# Patient Record
Sex: Female | Born: 1965 | Race: Black or African American | Hispanic: No | Marital: Single | Smoking: Never smoker
Health system: Southern US, Community
[De-identification: ages and names within clinical notes are randomized; demographics above are authoritative.]

## PROBLEM LIST (undated history)

## (undated) DIAGNOSIS — I10 Essential (primary) hypertension: Secondary | ICD-10-CM

## (undated) DIAGNOSIS — Z9289 Personal history of other medical treatment: Secondary | ICD-10-CM

## (undated) DIAGNOSIS — D649 Anemia, unspecified: Secondary | ICD-10-CM

## (undated) DIAGNOSIS — E785 Hyperlipidemia, unspecified: Secondary | ICD-10-CM

## (undated) DIAGNOSIS — D500 Iron deficiency anemia secondary to blood loss (chronic): Secondary | ICD-10-CM

## (undated) HISTORY — DX: Iron deficiency anemia secondary to blood loss (chronic): D50.0

## (undated) HISTORY — DX: Anemia, unspecified: D64.9

## (undated) HISTORY — DX: Hyperlipidemia, unspecified: E78.5

---

## 2004-08-14 ENCOUNTER — Ambulatory Visit: Payer: Self-pay | Admitting: Family Medicine

## 2006-04-07 ENCOUNTER — Emergency Department: Payer: Self-pay | Admitting: Emergency Medicine

## 2006-04-19 ENCOUNTER — Encounter (HOSPITAL_COMMUNITY): Admission: RE | Admit: 2006-04-19 | Discharge: 2006-05-18 | Payer: Self-pay | Admitting: Oncology

## 2006-04-19 ENCOUNTER — Ambulatory Visit: Payer: Self-pay | Admitting: Oncology

## 2006-04-19 ENCOUNTER — Ambulatory Visit: Payer: Self-pay | Admitting: Family Medicine

## 2006-04-19 LAB — CBC WITH DIFFERENTIAL (CANCER CENTER ONLY)
BASO#: 0 10*3/uL (ref 0.0–0.2)
BASO%: 0.4 % (ref 0.0–2.0)
EOS%: 1.5 % (ref 0.0–7.0)
HCT: 26 % — ABNORMAL LOW (ref 34.8–46.6)
HGB: 8 g/dL — ABNORMAL LOW (ref 11.6–15.9)
LYMPH#: 2.9 10*3/uL (ref 0.9–3.3)
MONO#: 0.5 10*3/uL (ref 0.1–0.9)
NEUT#: 5.6 10*3/uL (ref 1.5–6.5)
NEUT%: 60.7 % (ref 39.6–80.0)
RDW: 15.4 % — ABNORMAL HIGH (ref 10.5–14.6)
WBC: 9.2 10*3/uL (ref 3.9–10.0)

## 2006-04-19 LAB — COMPREHENSIVE METABOLIC PANEL
ALT: 13 U/L (ref 0–40)
Albumin: 4 g/dL (ref 3.5–5.2)
CO2: 20 mEq/L (ref 19–32)
Calcium: 9 mg/dL (ref 8.4–10.5)
Chloride: 107 mEq/L (ref 96–112)
Glucose, Bld: 143 mg/dL — ABNORMAL HIGH (ref 70–99)
Potassium: 4.1 mEq/L (ref 3.5–5.3)
Sodium: 138 mEq/L (ref 135–145)
Total Protein: 7.3 g/dL (ref 6.0–8.3)

## 2006-04-19 LAB — IRON AND TIBC
%SAT: 4 % — ABNORMAL LOW (ref 20–55)
Iron: 17 ug/dL — ABNORMAL LOW (ref 42–145)

## 2006-04-19 LAB — FOLATE: Folate: >20 ng/mL

## 2006-04-19 LAB — FERRITIN: Ferritin: 1 ng/mL — ABNORMAL LOW (ref 10–291)

## 2006-04-19 LAB — PROTIME-INR (CHCC SATELLITE): Protime: 12.5 Seconds (ref 10.6–13.4)

## 2006-04-19 LAB — LACTATE DEHYDROGENASE: LDH: 140 U/L (ref 94–250)

## 2006-04-19 LAB — RETICULOCYTES (CHCC): Retic Ct Pct: 1.6 % (ref 0.4–3.1)

## 2006-04-21 LAB — TSH: TSH: 1.967 u[IU]/mL (ref 0.350–5.500)

## 2006-04-21 LAB — VON WILLEBRAND PANEL: Ristocetin-Cofactor: 97 % (ref 50–150)

## 2006-05-03 ENCOUNTER — Ambulatory Visit: Payer: Self-pay | Admitting: Family Medicine

## 2006-05-03 ENCOUNTER — Encounter: Payer: Self-pay | Admitting: Family Medicine

## 2006-05-17 LAB — CBC WITH DIFFERENTIAL (CANCER CENTER ONLY)
BASO#: 0.2 10*3/uL (ref 0.0–0.2)
EOS%: 2.8 % (ref 0.0–7.0)
Eosinophils Absolute: 0.4 10*3/uL (ref 0.0–0.5)
HCT: 30.4 % — ABNORMAL LOW (ref 34.8–46.6)
HGB: 9.4 g/dL — ABNORMAL LOW (ref 11.6–15.9)
LYMPH#: 2.8 10*3/uL (ref 0.9–3.3)
MCHC: 31 g/dL — ABNORMAL LOW (ref 32.0–36.0)
NEUT#: 8.9 10*3/uL — ABNORMAL HIGH (ref 1.5–6.5)
NEUT%: 69.4 % (ref 39.6–80.0)
RBC: 4.14 10*6/uL (ref 3.70–5.32)

## 2006-05-24 ENCOUNTER — Ambulatory Visit: Payer: Self-pay | Admitting: Family Medicine

## 2006-06-03 ENCOUNTER — Ambulatory Visit: Payer: Self-pay | Admitting: Oncology

## 2006-06-07 LAB — CBC WITH DIFFERENTIAL (CANCER CENTER ONLY)
BASO%: 0.8 % (ref 0.0–2.0)
EOS%: 2.4 % (ref 0.0–7.0)
LYMPH#: 3.9 10*3/uL — ABNORMAL HIGH (ref 0.9–3.3)
MCHC: 31.6 g/dL — ABNORMAL LOW (ref 32.0–36.0)
MONO#: 0.8 10*3/uL (ref 0.1–0.9)
Platelets: 521 10*3/uL — ABNORMAL HIGH (ref 145–400)
RDW: 23.1 % — ABNORMAL HIGH (ref 10.5–14.6)
WBC: 11 10*3/uL — ABNORMAL HIGH (ref 3.9–10.0)

## 2006-06-07 LAB — IRON AND TIBC
%SAT: 24 % (ref 20–55)
Iron: 77 ug/dL (ref 42–145)
TIBC: 316 ug/dL (ref 250–470)
UIBC: 239 ug/dL

## 2006-06-07 LAB — FERRITIN: Ferritin: 104 ng/mL (ref 10–291)

## 2006-06-28 LAB — CBC WITH DIFFERENTIAL (CANCER CENTER ONLY)
BASO%: 0.7 % (ref 0.0–2.0)
EOS%: 1.7 % (ref 0.0–7.0)
LYMPH%: 36.9 % (ref 14.0–48.0)
MCH: 25.6 pg — ABNORMAL LOW (ref 26.0–34.0)
MCHC: 32.7 g/dL (ref 32.0–36.0)
MCV: 78 fL — ABNORMAL LOW (ref 81–101)
MONO#: 0.6 10*3/uL (ref 0.1–0.9)
MONO%: 6.2 % (ref 0.0–13.0)
Platelets: 512 10*3/uL — ABNORMAL HIGH (ref 145–400)
RDW: 19.1 % — ABNORMAL HIGH (ref 10.5–14.6)
WBC: 9 10*3/uL (ref 3.9–10.0)

## 2006-06-28 LAB — IRON AND TIBC: TIBC: 337 ug/dL (ref 250–470)

## 2006-07-13 ENCOUNTER — Ambulatory Visit: Payer: Self-pay | Admitting: Oncology

## 2006-08-23 DIAGNOSIS — D649 Anemia, unspecified: Secondary | ICD-10-CM

## 2006-08-23 HISTORY — DX: Anemia, unspecified: D64.9

## 2006-09-16 ENCOUNTER — Ambulatory Visit: Payer: Self-pay | Admitting: Oncology

## 2006-09-20 LAB — IRON AND TIBC
Iron: 58 ug/dL (ref 42–145)
UIBC: 267 ug/dL

## 2006-09-20 LAB — CBC WITH DIFFERENTIAL (CANCER CENTER ONLY)
BASO#: 0.1 10*3/uL (ref 0.0–0.2)
EOS%: 1.8 % (ref 0.0–7.0)
Eosinophils Absolute: 0.2 10*3/uL (ref 0.0–0.5)
HCT: 35.7 % (ref 34.8–46.6)
HGB: 11.5 g/dL — ABNORMAL LOW (ref 11.6–15.9)
LYMPH%: 33.6 % (ref 14.0–48.0)
MCH: 27.4 pg (ref 26.0–34.0)
MCHC: 32.4 g/dL (ref 32.0–36.0)
MCV: 85 fL (ref 81–101)
MONO%: 4.5 % (ref 0.0–13.0)
NEUT%: 59.5 % (ref 39.6–80.0)
RBC: 4.22 10*6/uL (ref 3.70–5.32)

## 2006-09-20 LAB — FERRITIN: Ferritin: 44 ng/mL (ref 10–291)

## 2006-12-27 ENCOUNTER — Ambulatory Visit: Payer: Self-pay | Admitting: Family Medicine

## 2007-02-07 ENCOUNTER — Ambulatory Visit: Payer: Self-pay | Admitting: Family Medicine

## 2007-03-08 ENCOUNTER — Emergency Department: Payer: Self-pay | Admitting: Emergency Medicine

## 2007-03-21 ENCOUNTER — Ambulatory Visit: Payer: Self-pay | Admitting: Family Medicine

## 2007-06-19 ENCOUNTER — Ambulatory Visit: Payer: Self-pay | Admitting: Gynecology

## 2007-06-21 ENCOUNTER — Emergency Department: Payer: Self-pay | Admitting: Emergency Medicine

## 2007-06-21 ENCOUNTER — Other Ambulatory Visit: Payer: Self-pay

## 2007-09-14 ENCOUNTER — Ambulatory Visit: Payer: Self-pay | Admitting: Oncology

## 2008-02-26 ENCOUNTER — Ambulatory Visit: Payer: Self-pay | Admitting: Oncology

## 2008-02-27 LAB — CBC WITH DIFFERENTIAL (CANCER CENTER ONLY)
BASO#: 0.1 10*3/uL (ref 0.0–0.2)
Eosinophils Absolute: 0.1 10*3/uL (ref 0.0–0.5)
HGB: 10.4 g/dL — ABNORMAL LOW (ref 11.6–15.9)
LYMPH%: 34.7 % (ref 14.0–48.0)
MCH: 23.9 pg — ABNORMAL LOW (ref 26.0–34.0)
MCV: 74 fL — ABNORMAL LOW (ref 81–101)
MONO%: 6.4 % (ref 0.0–13.0)
NEUT#: 5.3 10*3/uL (ref 1.5–6.5)
RBC: 4.36 10*6/uL (ref 3.70–5.32)

## 2008-02-27 LAB — IRON AND TIBC
%SAT: 5 % — ABNORMAL LOW (ref 20–55)
Iron: 18 ug/dL — ABNORMAL LOW (ref 42–145)
TIBC: 350 ug/dL (ref 250–470)

## 2008-02-27 LAB — FERRITIN: Ferritin: 6 ng/mL — ABNORMAL LOW (ref 10–291)

## 2008-03-25 ENCOUNTER — Ambulatory Visit: Payer: Self-pay | Admitting: Family Medicine

## 2008-04-02 ENCOUNTER — Ambulatory Visit (HOSPITAL_COMMUNITY): Admission: RE | Admit: 2008-04-02 | Discharge: 2008-04-02 | Payer: Self-pay | Admitting: Gynecology

## 2008-04-11 ENCOUNTER — Ambulatory Visit: Payer: Self-pay | Admitting: Obstetrics & Gynecology

## 2008-04-11 ENCOUNTER — Other Ambulatory Visit: Admission: RE | Admit: 2008-04-11 | Discharge: 2008-04-11 | Payer: Self-pay | Admitting: Obstetrics & Gynecology

## 2008-04-11 ENCOUNTER — Encounter: Payer: Self-pay | Admitting: Obstetrics & Gynecology

## 2008-04-11 LAB — CBC WITH DIFFERENTIAL (CANCER CENTER ONLY)
Eosinophils Absolute: 0.2 10*3/uL (ref 0.0–0.5)
LYMPH%: 26 % (ref 14.0–48.0)
MCH: 25.9 pg — ABNORMAL LOW (ref 26.0–34.0)
MCV: 80 fL — ABNORMAL LOW (ref 81–101)
MONO%: 6.4 % (ref 0.0–13.0)
NEUT%: 65.4 % (ref 39.6–80.0)
Platelets: 402 10*3/uL — ABNORMAL HIGH (ref 145–400)
RBC: 4.24 10*6/uL (ref 3.70–5.32)

## 2008-04-11 LAB — IRON AND TIBC
Iron: 51 ug/dL (ref 42–145)
UIBC: 213 ug/dL

## 2008-04-22 ENCOUNTER — Inpatient Hospital Stay (HOSPITAL_COMMUNITY): Admission: AD | Admit: 2008-04-22 | Discharge: 2008-04-22 | Payer: Self-pay | Admitting: Obstetrics & Gynecology

## 2008-04-22 ENCOUNTER — Ambulatory Visit (HOSPITAL_COMMUNITY): Admission: RE | Admit: 2008-04-22 | Discharge: 2008-04-22 | Payer: Self-pay | Admitting: Obstetrics & Gynecology

## 2008-05-02 ENCOUNTER — Ambulatory Visit: Payer: Self-pay | Admitting: Oncology

## 2008-05-07 ENCOUNTER — Ambulatory Visit: Payer: Self-pay | Admitting: Obstetrics & Gynecology

## 2008-05-07 LAB — CBC WITH DIFFERENTIAL (CANCER CENTER ONLY)
BASO#: 0 10*3/uL (ref 0.0–0.2)
Eosinophils Absolute: 0.2 10*3/uL (ref 0.0–0.5)
HCT: 29.7 % — ABNORMAL LOW (ref 34.8–46.6)
LYMPH#: 2.7 10*3/uL (ref 0.9–3.3)
MCH: 27.4 pg (ref 26.0–34.0)
MCHC: 33.6 g/dL (ref 32.0–36.0)
MCV: 82 fL (ref 81–101)
MONO#: 0.5 10*3/uL (ref 0.1–0.9)
NEUT#: 4.3 10*3/uL (ref 1.5–6.5)
WBC: 7.7 10*3/uL (ref 3.9–10.0)

## 2008-05-07 LAB — IRON AND TIBC
%SAT: 56 % — ABNORMAL HIGH (ref 20–55)
TIBC: 323 ug/dL (ref 250–470)
UIBC: 143 ug/dL

## 2008-05-16 ENCOUNTER — Ambulatory Visit: Payer: Self-pay | Admitting: Obstetrics & Gynecology

## 2008-05-20 ENCOUNTER — Ambulatory Visit: Payer: Self-pay | Admitting: Family Medicine

## 2008-06-10 LAB — CBC WITH DIFFERENTIAL (CANCER CENTER ONLY)
Eosinophils Absolute: 0.2 10*3/uL (ref 0.0–0.5)
HCT: 35.1 % (ref 34.8–46.6)
LYMPH%: 30.7 % (ref 14.0–48.0)
MCH: 27.2 pg (ref 26.0–34.0)
MCV: 82 fL (ref 81–101)
MONO#: 0.7 10*3/uL (ref 0.1–0.9)
MONO%: 6.3 % (ref 0.0–13.0)
NEUT%: 60.3 % (ref 39.6–80.0)
Platelets: 397 10*3/uL (ref 145–400)
RBC: 4.29 10*6/uL (ref 3.70–5.32)
WBC: 10.7 10*3/uL — ABNORMAL HIGH (ref 3.9–10.0)

## 2008-06-10 LAB — IRON AND TIBC: Iron: 64 ug/dL (ref 42–145)

## 2008-06-10 LAB — FERRITIN: Ferritin: 19 ng/mL (ref 10–291)

## 2008-08-07 ENCOUNTER — Ambulatory Visit: Payer: Self-pay | Admitting: Oncology

## 2008-08-08 LAB — CBC WITH DIFFERENTIAL (CANCER CENTER ONLY)
BASO#: 0.1 10*3/uL (ref 0.0–0.2)
Eosinophils Absolute: 0.2 10*3/uL (ref 0.0–0.5)
HGB: 10.1 g/dL — ABNORMAL LOW (ref 11.6–15.9)
LYMPH%: 32.5 % (ref 14.0–48.0)
MCH: 25.8 pg — ABNORMAL LOW (ref 26.0–34.0)
MCV: 80 fL — ABNORMAL LOW (ref 81–101)
MONO#: 0.5 10*3/uL (ref 0.1–0.9)
MONO%: 4.6 % (ref 0.0–13.0)
NEUT#: 6 10*3/uL (ref 1.5–6.5)
RBC: 3.91 10*6/uL (ref 3.70–5.32)
WBC: 10 10*3/uL (ref 3.9–10.0)

## 2008-08-09 LAB — IRON AND TIBC
%SAT: 8 % — ABNORMAL LOW (ref 20–55)
Iron: 26 ug/dL — ABNORMAL LOW (ref 42–145)
UIBC: 314 ug/dL

## 2008-08-09 LAB — FERRITIN: Ferritin: 8 ng/mL — ABNORMAL LOW (ref 10–291)

## 2008-10-29 ENCOUNTER — Ambulatory Visit: Payer: Self-pay | Admitting: Oncology

## 2008-11-04 LAB — CBC WITH DIFFERENTIAL (CANCER CENTER ONLY)
BASO#: 0.1 10*3/uL (ref 0.0–0.2)
Eosinophils Absolute: 0.2 10*3/uL (ref 0.0–0.5)
HGB: 9.7 g/dL — ABNORMAL LOW (ref 11.6–15.9)
LYMPH#: 3.2 10*3/uL (ref 0.9–3.3)
NEUT#: 6.9 10*3/uL — ABNORMAL HIGH (ref 1.5–6.5)
Platelets: 492 10*3/uL — ABNORMAL HIGH (ref 145–400)
RBC: 4.03 10*6/uL (ref 3.70–5.32)
WBC: 11.1 10*3/uL — ABNORMAL HIGH (ref 3.9–10.0)

## 2008-11-12 ENCOUNTER — Ambulatory Visit: Payer: Self-pay | Admitting: Obstetrics & Gynecology

## 2008-11-14 ENCOUNTER — Ambulatory Visit (HOSPITAL_COMMUNITY): Admission: RE | Admit: 2008-11-14 | Discharge: 2008-11-14 | Payer: Self-pay | Admitting: Family Medicine

## 2008-12-31 ENCOUNTER — Ambulatory Visit (HOSPITAL_COMMUNITY): Admission: RE | Admit: 2008-12-31 | Discharge: 2008-12-31 | Payer: Self-pay | Admitting: Obstetrics & Gynecology

## 2008-12-31 ENCOUNTER — Encounter: Payer: Self-pay | Admitting: Obstetrics & Gynecology

## 2008-12-31 ENCOUNTER — Ambulatory Visit: Payer: Self-pay | Admitting: Obstetrics & Gynecology

## 2008-12-31 HISTORY — PX: OTHER SURGICAL HISTORY: SHX169

## 2009-01-14 ENCOUNTER — Ambulatory Visit: Payer: Self-pay | Admitting: Obstetrics & Gynecology

## 2009-03-17 IMAGING — CR DG ABDOMEN 1V
1 series · 1 of 1 positions shown · non-contrast
Comparison: Pelvic ultrasound 04/02/2008.

CLINICAL DATA: 42-year-old female evaluate for IUD.

ABDOMEN - 1 VIEW

[t abdomen supine]
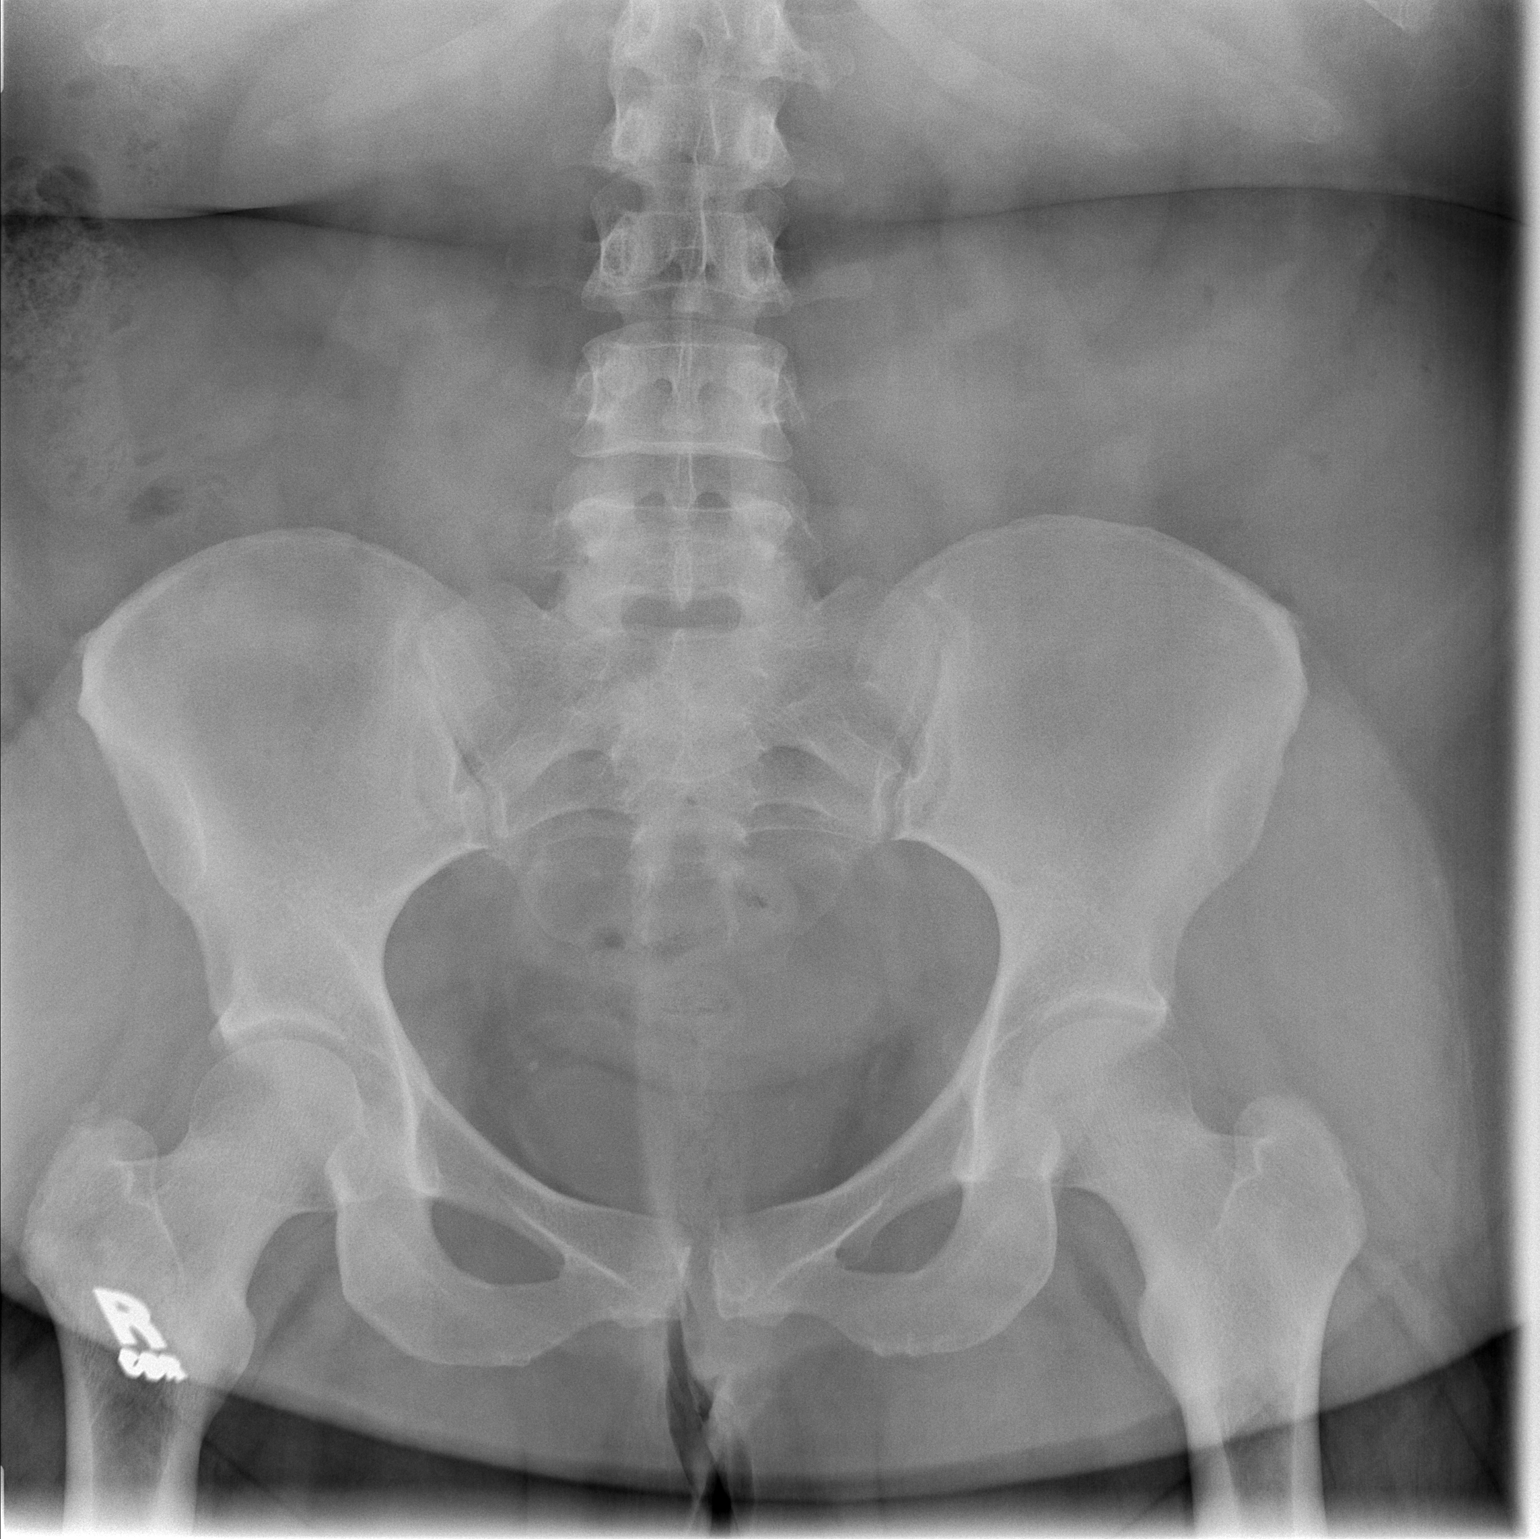

[1 of 1 positions shown; findings below may reference images not displayed]

FINDINGS: Bone mineralization is within normal limits.  Paucity of
bowel gas.  Several pelvic phleboliths.  No intrauterine device
identified.  No metallic density foreign body is identified in the
visualized lower abdomen or pelvis. No acute osseous abnormality
identified.
IMPRESSION: No IUD or any metallic structure identified in the visualized lower
abdomen or pelvis.

## 2009-04-03 ENCOUNTER — Ambulatory Visit: Payer: Self-pay | Admitting: Oncology

## 2009-05-19 ENCOUNTER — Ambulatory Visit: Payer: Self-pay | Admitting: Oncology

## 2010-12-01 LAB — BASIC METABOLIC PANEL
BUN: 3 mg/dL — ABNORMAL LOW (ref 6–23)
CO2: 25 mEq/L (ref 19–32)
Chloride: 102 mEq/L (ref 96–112)
Glucose, Bld: 118 mg/dL — ABNORMAL HIGH (ref 70–99)
Potassium: 3.1 mEq/L — ABNORMAL LOW (ref 3.5–5.1)

## 2010-12-01 LAB — CBC
HCT: 34.9 % — ABNORMAL LOW (ref 36.0–46.0)
MCV: 77.7 fL — ABNORMAL LOW (ref 78.0–100.0)
Platelets: 516 10*3/uL — ABNORMAL HIGH (ref 150–400)
RDW: 17.4 % — ABNORMAL HIGH (ref 11.5–15.5)

## 2010-12-01 LAB — HCG, SERUM, QUALITATIVE: Preg, Serum: NEGATIVE

## 2011-01-05 NOTE — Assessment & Plan Note (Signed)
Courtney Cox, Courtney Cox               ACCOUNT NO.:  [id number]   MEDICAL RECORD NO.:  [id number]          PATIENT TYPE:  POB   LOCATION:  [facility] at [facility]         FACILITY:  [facility]   PHYSICIAN:  Johnella Moloney, MD        DATE OF BIRTH:  October 11, 1965   DATE OF SERVICE:  11/12/2008                                  CLINIC NOTE   CHIEF COMPLAINT:  Menometrorrhagia, anemia, desires surgical management.   HISTORY OF PRESENT ILLNESS:  The patient is a 45 year old gravida 4,  para 3-0-1-3, who has been followed in the clinic since 2007 for  menometrorrhagia.  The patient has had multiple episodes of having  needing transfusions for hemoglobin as low as 5.  She has had an Mirena  IUD trial in the past and has also been tried on Lupron recently.  The  patient was initially offered an endometrial ablation in September 2009,  but she was unable to do this at that time.  She was then just given  Lupron which the patient said made her feel crazy, and she does not  want to try this again.  The patient is interested in a nonsurgical  management.  She does report that her last menstrual period started on  November 05, 2008, and lasted for 7 days.  She said that she checked her  hemoglobin at an outside medical center and that it was very low, but  she did not need a transfusion.  She denies any presyncope,  lightheadedness, dizziness, or any other concerns currently.   PAST MEDICAL HISTORY:  1. Type 2 diabetes.  2. Hypertension.  3. Hypercholesteremia.  4. Morbid obesity.  5. Chronic anemia.   PAST SURGICAL HISTORY:  Oral surgery, no abdominal surgery.   MEDICATIONS:  1. Lipitor 20 mg p.o. daily.  2. Hydrochlorothiazide 25 mg p.o. daily.  3. Lovaza.  4. Fish oil daily.  5. Aspirin daily.  6. Vitamin D daily.   ALLERGIES:  No known drug allergies.  The patient is not allergic to  latex.   FAMILY HISTORY:  Remarkable for hypertension, coronary arteries, and  diabetes.   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  She has no tobacco, alcohol, or illicit drug use history.   PAST OB/GYN HISTORY:  The patient has had 3 vaginal deliveries and 1  miscarriage.  She has had menometrorrhagia for several years and has had  negative endometrial biopsies.  Her last Pap smear was in August 2009,  also was normal.   PHYSICAL EXAMINATION:  VITAL SIGNS:  Blood pressure 144/87, pulse 107,  weight 320 pounds, height 5 feet 5-1/2 inches.  GENERAL:  No apparent distress.  ABDOMEN:  Soft, obese, nontender, and nondistended.  PELVIC:  Normal external female genitalia.  Pink and well-rugated  vagina.  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  She said that this has been  there for several years.  She has a multiparous cervix and normal  contour.  No abnormal drainage or discharge seen.  On Valsalva, the  patient is noted to have very good descent.  On bimanual exam, the  patient had a normal-sized uterus.  No abnormal masses palpated.  However, exam was difficult secondary to habitus.   ASSESSMENT AND PLAN:  The patient is a 45 year old, gravida 4, para 3-0-  1-3, with long history of menometrorrhagia that has not been alleviated  by multiple modalities.  The patient is interested in surgical  evaluation and management.  She was initially interested in hysterectomy  but given her multiple medical problems and her obesity, she is not a  good surgical candidate for major surgery.  At this point, the patient  was counseled regarding getting an endometrial ablation.  On ultrasound  that was done in August 2009, she was noted to have a 4.8-cm fibroid in  the central uterus likely submucosal, and this could be tough for an  endometrial ablation.  However, the patient could have a hysteroscopic  myomectomy and then an ablation and this is what was told to her.  Also  given that she has taken Lupron,  there could be significant decrease in  the size of the submucosal fibroid.  As such, I will get another pelvic  ultrasound just to follow this to see if she will be a good candidate  for a hydrothermal ablation.  She was given written information about  hydrothermal dilation and the risks and benefits of this.  The patient  is interested in this modality for now.  She does understand that if  this does not work then the next option would be to do hysterectomy.  Given her exam, she will be a good candidate for vaginal hysterectomy if  this is needed.  The risks of surgery including bleeding, infection,  injury to surrounding organs, need for additional procedures were  explained to the patient and she was told that these risks will be re-  reviewed on the day of surgery.  She was told to expect a call from the  surgical scheduler about the time and date of her surgery.           ______________________________  Johnella Moloney, MD     UD/MEDQ  D:  11/12/2008  T:  11/13/2008  Job:  [id number]

## 2011-01-05 NOTE — Op Note (Signed)
Courtney Cox, Courtney Cox               ACCOUNT NO.:  [id number]   MEDICAL RECORD NO.:  [id number]          PATIENT TYPE:  AMB   LOCATION:  [facility]                           FACILITY:  [facility]   PHYSICIAN:  Norton Blizzard, MD    DATE OF BIRTH:  1966/07/12   DATE OF PROCEDURE:  12/31/2008  DATE OF DISCHARGE:                               OPERATIVE REPORT   PREOPERATIVE DIAGNOSES:  1. Menometrorrhagia.  2. Morbid obesity.   POSTOPERATIVE DIAGNOSES:  1. Menometrorrhagia.  2. Morbid obesity.   PROCEDURE:  Hysteroscopy, dilation and curettage, hydrothermal  endometrial ablation.   SURGEON:  Norton Blizzard, MD   ANESTHESIA:  General LMA and a paracervical block, using 30 mL of 0.5%  Marcaine.   IV FLUIDS:  500 mL of lactated Ringers.   ESTIMATED BLOOD LOSS:  50 mL.   URINE OUTPUT:  The patient was straight catheterized in the beginning of  the case for an unmeasured amount of clear yellow urine.   INDICATIONS:  The patient is a 45 year old gravida 4, para 3-0-1-3 with  a long history of menometrorrhagia and symptomatic anemia requiring  transfusions in the past.  She has also tried a Mirena IUD and Lupron  which did not help her symptoms.  The patient also has a past medical  history is notable for type 2 diabetes, hypertension, morbid obesity,  and chronic anemia.  The patient desired surgical management and agreed  to hydrothermal ablation.  Of note, she did have an ultrasound done in  March 2010, which showed an enlarged uterus about 13-weeks size with an  anterior-posterior width of 7.8 cm and transverse width of 8.9 cm and 3  distinct fibroids were noted including one that measured about 5.3 cm in  diameter located in the right lateral fundus was deviating the  endometrial canal, suggesting a possible submucosal component.  There  other ones were less than 2 cm in diameter.  She had normal bilateral  adnexa.  Given the possibility of a significant submucosal component,  the patient  was also counseled regarding possible hysteroscopic  myomectomy prior to the ablation.  The risks of surgery including  bleeding, infection, injury to surrounding organs and uterus, need for  additional procedures, electrolyte imbalances due to the distention  medium were discussed with the patient and written informed consent was  obtained.   FINDINGS:  A 13-week-sized fibroid uterus, diffuse proliferative  endometrium.  No submucosal fibroid was noted, but there was a diffuse  proliferative endometrium and possible polypoid tissue. Endometrial  curettings were obtained prior to the ablation. Total ablation time was  14 minutes and 30 seconds at a temperature of 99-degree centigrade.  Normal ostia bilaterally.   SPECIMENS:  Endometrial curettings.   DISPOSITION:  Specimens to Pathology.   COMPLICATIONS:  None immediate.   PROCEDURE IN DETAILS:  The patient had sequential compressive devices  applied to her bilateral lower extremities and had 2 g of cefotetan  administered intravenously while in the preoperative room.  She was then  taken to the operating room where general anesthesia with LMA was  administered  and found to be adequate.  The patient was then placed in  the dorsal lithotomy position, and prepped and draped in a sterile  manner.  Her bladder was straight catheterized for an unmeasured amount  of clear yellow urine.  Attention was turned to the patient's pelvis  where a speculum was placed in the patient's vagina, and the anterior  lip of the cervix was grasped with a tenaculum.  A paracervical block  was then administered using 0.5% Marcaine and a total of 30 mL was used.  At this point, the cervix was dilated to accommodate the metal curette.  A diffuse curettage of the endometrial cavity was done and a moderate  amount of tissue was obtained.  The endometrial curettings were sent to  Pathology.  The cervix was then dilated to accommodate the hydrothermal  ablation  hysteroscopic apparatus.  This hysteroscope was then gently  advanced into the uterine fundus and small amount of bleeding was noted  diffusely status post curettage.  The uterus was distended using normal  saline and then the hydrothermal ablation was carried out according to  protocol using a temperature of 99-degree centigrade.  At the end of the  10-minute period for the ablation, there was a significant blanching and  destruction of the endometrium noted.  However, at the top of the  fundus, there was a few areas that were noted to be pink in appearance  suggesting inadequqte ablation.  The decision was made to proceed with  the additional ablation time, so the hysteroscope was left in place and  the ablation was carried out again to make sure that the fundal parts of  the endometrium were maximally ablated.  An additional 4 minutes and 30  seconds was done giving a total ablation time of 14 minutes and 30  seconds.  Total ablation of the uterine cavity was noted at this point.  The fluid was then cooled and also removed from the endometrial cavity  and the hysteroscope was then removed from the uterus.  Small amount of  bleeding was noted at the end of the procedure.  The tenaculum was  removed and all other instruments were removed from the patient's  pelvis.  The patient tolerated the procedure well.  Sponge and  instrument counts were correct x2.  She was taken to the recovery room  awake, extubated, and in stable condition.  The patient will follow up  in [facility] in 2-3 weeks for her postoperative evaluation and  followup of the pathology results.      Norton Blizzard, MD  Electronically Signed     UAD/MEDQ  D:  12/31/2008  T:  01/01/2009  Job:  [id number]

## 2011-01-05 NOTE — Assessment & Plan Note (Signed)
[name], Courtney Cox               ACCOUNT NO.:  [id number]   MEDICAL RECORD NO.:  [id number]          PATIENT TYPE:  POB   LOCATION:  [facility] at [facility]         FACILITY:  [facility]   PHYSICIAN:  Elsie Lincoln, MD      DATE OF BIRTH:  [birth year]   DATE OF SERVICE:  04/11/2008                                  CLINIC NOTE   The patient is a 45 year old female here for Pap smear endometrial  biopsy.  The patient  __________ hysterectomy over fall break.  She does  have a submucosal fibroid and supposedly has an IUD in place.  However,  they cannot be convinced that there is an IUD based on the disruption of  the endometrial canal from the fibroid.  During the procedure, I noted  that the patient had a large vaginal polyp coming from the left side of  the vagina.  The patient states that it has been there for many years.  It appears fleshy and no evidence of any abnormalities.  It is  nontender.  It is difficult to feel on bimanual.  The patient has a very  long vagina and a well-supported cervix.  The uterus sounded 10 cm and a  blood sample was obtained using sterile technique.  Given that the  patient is unsure that she actually has an IUD in place, I do think that  we should get a flat plate just to make sure that there is an IUD in the  vicinity of the uterus.  There are no strings seen.  She might be a good  candidate for hydrothermal ablations, and if she fails that, then she  can go to hysterectomy.  I do think it will be very difficult both  abdominally or vaginally, and maybe the __________ would be better .  The patient is interested in more conservative measures if possible.  As  is seen today the patient got a Pap smear, endometrial biopsy, and then  we will get the abdominal flat plate and then follow up in 2 weeks for  results where she will discuss with either myself or Dr. Shawnie Pons surgical  options.           ______________________________  Elsie Lincoln, MD     KL/MEDQ  D:  04/11/2008  T:  04/12/2008  Job:  [id number]

## 2011-01-05 NOTE — Assessment & Plan Note (Signed)
Courtney Cox, Courtney Cox               ACCOUNT NO.:  [id number]   MEDICAL RECORD NO.:  [id number]          PATIENT TYPE:  POB   LOCATION:  [facility] at [facility]         FACILITY:  [facility]   PHYSICIAN:  Tinnie Gens, MD        DATE OF BIRTH:  January 24, 1966   DATE OF SERVICE:  02/07/2007                                  CLINIC NOTE   CHIEF COMPLAINT:  Abnormal bleeding.   HISTORY OF PRESENT ILLNESS:  The patient is a 45 year old gravida 4,  para 3-0-1-3 who has previously been seen here for abnormal uterine  bleeding.  She was started on oral contraceptives which seemed to ease  her bleeding and the patient has previously undergone an endometrial  sampling which was normal.  She had a pelvic sonogram that showed a 4 x  4 cm mass in the endometrial cavity.  A repeat of this sonogram was done  in early May and continues to show the same mass with no interval change  but given that it not changed in size and she has a normal endometrial  biopsy, we think we could follow this with an ultrasound again in about  6 months.  The patient is here today for IUD insertion to control her  bleeding.  Her LMP is approximately 30 days ago and it was fairly heavy  at the time.  A UPT is negative today.   PHYSICAL EXAMINATION:  VITAL SIGNS:  On exam, her vitals are as noted in  the chart.  She is an obese female in no acute distress.  Blood pressure  is 141/104.  GU:  Normal external female genitalia.  The BUS is normal.  Vagina is  pink and rugated.  Cervix is parous without lesion.   PROCEDURE:  Cervix is visualized and grasped with a single-tooth  tenaculum and cleaned with  Betadine x2.  Uterus was sounded to 9 cm.  A  Mirena IUD is inserted without difficulty.  Strings were trimmed to 1.5-  2 cm.  All instruments were removed from the vagina.  The patient  tolerated the procedure well.   IMPRESSION:  1. Abnormal uterine bleeding.  2. Abnormal finding by pelvic sonogram unchanged over an 68-month  interval with normal endometrial sampling.  3. Obesity.   PLAN:  1. Mirena for bleeding, to quell her bleeding.  I did warn her about      abnormal spotting.  2. Follow up in 2 weeks to check IUD placement and to discuss if she      needs one pack of pills to regulate her cycle.  3. Will follow up pelvic sonogram in about 6 months to see if anything      has changed in  this uterine growth.  If      this remains stable, I would not do any further imaging after that.      If it does seem to have changed, I would consider a repeat      endometrial sampling.           ______________________________  Tinnie Gens, MD     TP/MEDQ  D:  02/07/2007  T:  02/08/2007  Job:  [id number] 

## 2011-01-05 NOTE — Assessment & Plan Note (Signed)
NAMERAYELLE, ARMOR               ACCOUNT NO.:  [id number]   MEDICAL RECORD NO.:  [id number]          PATIENT TYPE:  POB   LOCATION:  [facility]         FACILITY:  [facility]   PHYSICIAN:  Tinnie Gens, MD        DATE OF BIRTH:  1966-07-24   DATE OF SERVICE:                                  CLINIC NOTE   HISTORY OF PRESENT ILLNESS:  The patient is a 41-year gravida 4, para 3-  0-1-3, who was previously seen for IUD insertion.  Her insertion went  well and she has had normal cycles up until the last cycle which was  exceptionally heavy for her.  Additionally, the patient has had a  previous ultrasound that shows a 4 cm endometrial mass with a negative  endometrial biopsy in the past.  She has not had a follow up ultrasound  since May 2008.  The patient did have a recent blood pressure change and  her blood pressure is much higher today than it has been previously.  Otherwise, she seems to be doing well.   PHYSICAL EXAMINATION:  VITAL SIGNS:  Her vitals are as in the chart.  Her blood pressure is 162/107 today.  GENERAL:  She is a female in no acute distress.  ABDOMEN:  Soft, nontender, nondistended.   IMPRESSION:  1. Abnormal uterine bleeding status post intrauterine device insertion      with heavy cycle recently.  2. Hypertension, poorly controlled.   PLAN:  1. Patient will go back to primary care physician for blood pressure      control.  2. Pelvic sonogram results and see how her next cycle does.  If there      continues to be a  mass there, would consider repeat endometrial      sampling.  3. If her cycles continue to be heavy, would consider looking for a      hysterectomy.           ______________________________  Tinnie Gens, MD     TP/MEDQ  D:  06/19/2007  T:  06/20/2007  Job:  [id number]

## 2011-01-05 NOTE — Assessment & Plan Note (Signed)
Courtney Cox, Courtney Cox               ACCOUNT NO.:  [id number]   MEDICAL RECORD NO.:  [id number]          PATIENT TYPE:  POB   LOCATION:  [facility]         FACILITY:  [facility]   PHYSICIAN:  Tinnie Gens, MD        DATE OF BIRTH:  [birth year]   DATE OF SERVICE:  03/21/2007                                  CLINIC NOTE   CHIEF COMPLAINT:  Followup IUD insertion.   HISTORY OF PRESENT ILLNESS:  The patient is a 45 year old, gravida 4,  para 3-0-1-3 who was previously seen in May for IUD insertion.  She has  had 1 cycle that has had minimal bleeding.  No loss of energy with this  cycle and she feels pretty good.  She comes back in today for IUD check  and has had no problems.   PHYSICAL EXAMINATION:  VITAL SIGNS:  On exam, her vitals are as in the  chart.  She has gained another 5 pounds and was up to 321 today.  Blood  pressure is 122/91.  She is obese female in no acute distress.  GU:  Normal external female genitalia.  Vagina is pink and rugated.  Cervix is parous without lesion, and IUD string is easily visible.   IMPRESSION:  1. Menorrhagia.  2. Obesity.  3. Status post intrauterine device insertion.   PLAN:  Continue IUD.  Followup again in 3 months.  Would consider repeat  transvaginal sonogram at her next visit.           ______________________________  Tinnie Gens, MD     TP/MEDQ  D:  03/21/2007  T:  03/22/2007  Job:  [id number]   cc:    Family Practice at [facility]

## 2011-01-05 NOTE — Assessment & Plan Note (Signed)
[name], Courtney Cox               ACCOUNT NO.:  [id number]   MEDICAL RECORD NO.:  [id number]          PATIENT TYPE:  POB   LOCATION:  [facility]         FACILITY:  [facility]   PHYSICIAN:  Elsie Lincoln, MD      DATE OF BIRTH:  08-12-1966   DATE OF SERVICE:  01/14/2009                                  CLINIC NOTE   The patient is a 45 year old female who presents for her postoperative  check.  The patient had menorrhagia and had failed IUD in the past.  She  underwent a hysteroscopy, D&C, and hydrothermal ablation with Dr. Johnella Moloney on Dec 31, 2008.  The pathology showed fragments of benign  endometrial polyp, degenerating secretory-type endometrium.  No atypia,  hyperplasia, or malignancy identified.  Postoperatively, the patient has  done well.  She is not having any bleeding.  She does have normal watery  discharge that we see after her hydrothermal ablation.  The patient is  very happy with the procedure.   PHYSICAL EXAMINATION:  VITAL SIGNS:  Pulse 105, blood pressure 129/93,  weight 319, height 5 feet and 5-1/2 inches.  GENERAL:  Well nourished, well developed in no apparent distress.  ABDOMEN:  Soft, nontender.  GENITALIA:  Tanner V.  Vagina pink, normal rugae.  Cervix closed,  nontender.   ASSESSMENT AND PLAN:  A 45 year old female status post hydrothermal  ablation.  1. The patient is doing very well.  Discussed bleeding profile and the      patient seems to be pleased.  2. The patient is due for Pap smear in August 2010.  We will schedule      for that.  3. The patient is followed up by her primary care physician for      mammogram.           ______________________________  Elsie Lincoln, MD     KL/MEDQ  D:  01/14/2009  T:  01/15/2009  Job:  [id number]

## 2011-01-05 NOTE — Assessment & Plan Note (Signed)
NAMESAVINA, OLSHEFSKI               ACCOUNT NO.:  [id number]   MEDICAL RECORD NO.:  [id number]          PATIENT TYPE:  POB   LOCATION:  [facility]         FACILITY:  [facility]   PHYSICIAN:  Tinnie Gens, MD        DATE OF BIRTH:  [birth year]   DATE OF SERVICE:  03/25/2008                                  CLINIC NOTE   CHIEF COMPLAINT:  Followup.   HISTORY:  This patient is a 45 year old gravida 4, para 3-0-1-3 who  previously had an IUD inserted for heavy vaginal bleeding.  She reports  that her hemoglobin was again down to 5.  She is on IV iron.  Last  hemoglobin that was sent to me showed a hemoglobin of 10.9 from February 29, 2008 and she has had normal echo, carotid artery Dopplers, and EKG.  I  think they felt the patient needed hysterectomy.  The patient reports  that she thinks she is bleeding fairly heavily with her IUD inserted.  She desires hysterectomy.  Her last ultrasound revealed a uterus that  was 13.4 x 6.8 x 6.8.  The patient has previously had an endometrial  mass that had not specifically changed at her last ultrasound which was  on May 2008.  She has previously had undergone endometrial sampling  which was done on September 2007 that was normal.  Given this, it has  been 2 years since her last endometrial sampling and her last Pap smear,  I think she should get endometrial sampling, Pap smear, and pelvic  sonogram.  If things appear about the same, we would schedule her for a  transvaginal hysterectomy.  The patient would like to schedule this over  a fall break if possible.  She will follow up in 2-3 weeks for  endometrial biopsy and Pap smear.           ______________________________  Tinnie Gens, MD     TP/MEDQ  D:  03/25/2008  T:  03/26/2008  Job:  [id number]

## 2011-01-05 NOTE — Assessment & Plan Note (Signed)
NAMEISABEL, Courtney Cox               ACCOUNT NO.:  [id number]   MEDICAL RECORD NO.:  [id number]          PATIENT TYPE:  POB   LOCATION:  [facility]         FACILITY:  [facility]   PHYSICIAN:  Elsie Lincoln, MD      DATE OF BIRTH:  [birth year]   DATE OF SERVICE:  05/07/2008                                  CLINIC NOTE   The patient is a 45 year old female who presents after coming to the ER  for vaginal bleeding.  The patient thought she had an IUD in place.  However, ultrasound and on flat plate reveals no IUD in the pelvis or  the uterus.  She has a normal size uterus, but does have a submucosal  fibroid that is causing her bleeding.  She is morbidly obese and has  hard abdominal or vaginal __________.  We talked about D&C,  hysteroscopy, and endometrial ablation.  Given that this fibroid is a  rather large approximately 4 cm, we started shrinking the fibroid with  Lupron.  She received 1 month dose on August 28.  She will receive a 3  months dose on September 28.  The patient will also be scheduled for a D  and C, hysteroscopy, and hydrothermal ablation at the end of October in  case she loses her Medicaid.  If she does not lose her Medicaid, we will  postpone the procedure until December when all the Lupron can take  effect.  The patient understands the procedure and risks of the  procedure.  She is agreeable to this procedure.  The patient is to come  back September 28 for an RN visit only for administration of Lupron.           ______________________________  Elsie Lincoln, MD     KL/MEDQ  D:  05/07/2008  T:  05/08/2008  Job:  [id number]

## 2011-01-08 NOTE — Assessment & Plan Note (Signed)
Courtney Cox, Courtney Cox               ACCOUNT NO.:  [id number]   MEDICAL RECORD NO.:  [id number]          PATIENT TYPE:  POB   LOCATION:  [facility]         FACILITY:  [facility]   PHYSICIAN:  Tinnie Gens, MD        DATE OF BIRTH:  1966-05-15   DATE OF SERVICE:  05/24/2006                                    CLINIC NOTE   CHIEF COMPLAINT:  Followup.   HISTORY OF PRESENT ILLNESS:  The patient is a 45 year old gravida 4, para 3-  0-1-3 who previously had been seen for severe anemia related to heavy  vaginal bleeding.  The patient had been started on birth control pills and  had taken those, the bleeding had stopped.  The patient reports that she had  a cycle May 05, 2006 and her bleeding seemed to have stopped; she has  not had any since then.  She has not been on any pills since then.  The  patient underwent endometrial biopsy, Pap smear and pelvic ultrasound since  her last visit here.  Those results are reviewed today.   DATA REVIEWED:  Her Pap smear was normal.   Her endometrial biopsy showed benign portions of weakly proliferative and  secretory-type endometrium with features of exogenous progestational state,  and hyperplasia or malignancy was identified in what was felt to be a good  sample.   Her pelvic sonogram showed the uterus measured 13 by 5.7 by 7.6 cm.  There  was a random area of increased echogenicity that was approximately 4 by 4 cm  and appears to be within the endometrial cavity near the fundus.  The right  and left ovaries were poorly identified.  The synopsis of the pelvic  sonogram stated that an underlying endometrial mass lesion could not be  excluded, or this could represent submucosal fibroid with extension.  Given  the patient had no history of heavy bleeding until her latest episode and  the fact she had a benign endometrial biopsy, it seems unlikely that this  mass really means anything.   PHYSICAL EXAMINATION:  VITAL SIGNS:  As noted in chart.   Weight is 298.  Pulse is 100, which is up.  GENERAL:  Obese female in no acute distress.  ABDOMEN:  Soft, nontender and nondistended.   IMPRESSION:  1. Heavy vaginal bleeding.  2. Question of endometrial submucosal fibroid versus polyp versus other,      with negative endometrial biopsy.   PLAN:  1. The patient wishes to wait and see exactly what her cycles will be like      without medication.  She did have a serious illness prior to this event      and she is hoping that everything will get back to normal for her.  2. Advised the patient that should her bleeding come back heavy, she is to      use birth control pills to stop her bleeding immediately.  3. We will followup in 3 months and possibly either repeat the sonogram or      may move to pelvic MRI, which may better elucidate exactly what type of      lesion this  is.  4. All variety of treatments for abnormal bleeding, as well as treatment      for fibroids, were discussed with this patient.  She seems to      understand all of these and would like to just do watchful waiting, if      possible.           ______________________________  Tinnie Gens, MD     TP/MEDQ  D:  05/24/2006  T:  05/25/2006  Job:  [id number]

## 2011-01-08 NOTE — Assessment & Plan Note (Signed)
Courtney Cox, Courtney Cox               ACCOUNT NO.:  [id number]   MEDICAL RECORD NO.:  [id number]          PATIENT TYPE:  POB   LOCATION:  [facility]         FACILITY:  [facility]   PHYSICIAN:  Tinnie Gens, MD        DATE OF BIRTH:  [birth year]   DATE OF SERVICE:  05/03/2006                                    CLINIC NOTE   CHIEF COMPLAINT:  Follow-up.   HISTORY OF PRESENT ILLNESS:  Patient is a 45 year old gravida 4, para 3-0-1-  3, who came in with __________ anemia and heavy vaginal bleeding after a  bout with pneumonia.  The patient was started on OvCon 1/50s and her  bleeding resolved.  She then did not need further blood transfusions and her  bleeding has since stopped.  She is on the first week of second pill pack of  OvCon 1/50s.  On her last visit temperature was __________  could not be  done.  She did have bladder stones.  Outside source had normal TSH, normal  bleeding profiles.   PROCEDURE:  __________ vagina and cervix was visualized, cleaned with  Betadine x 2.  The uterus sounded to approximately 9 cm and endometrial  sampling was done with a pipelle x3.  The patient tolerated the procedure  well.   IMPRESSION:  Heavy vaginal bleeding, unclear etiology.  No laboratory  abnormality as of yet.   PLAN:  Check endometrial sampling, pelvic ultrasound, follow-up in three  weeks for results and discussion of treatment.           ______________________________  Tinnie Gens, MD     TP/MEDQ  D:  05/03/2006  T:  05/04/2006  Job:  [id number]

## 2011-01-08 NOTE — Assessment & Plan Note (Signed)
Courtney Cox               ACCOUNT NO.:  [id number]   MEDICAL RECORD NO.:  [id number]          PATIENT TYPE:  POB   LOCATION:  [facility]         FACILITY:  [facility]   PHYSICIAN:  Tinnie Gens, MD        DATE OF BIRTH:  04-03-1966   DATE OF SERVICE:  12/27/2006                                  CLINIC NOTE   CHIEF COMPLAINT:  Followup.   HISTORY OF PRESENT ILLNESS:  The patient is a 45 year old gravida 4,  para III, 0, I, III, has previously been seen here with abnormal uterine  bleeding back in September of 2007 that required blood transfusion and  severe anemia.  At that time, bleeding was stopped on Ovcon 1/50.  Since  that time, the patient has had normal cycles in October, November,  December, January, February, March.  She had two cycles in April with  heavy vaginal bleeding.  She does have a CBC pending from her regular  doctor's office from yesterday.  The patient reports feeling weak with a  fast heart rate and having very pale skin.  She is on iron pills and her  bleeding has since stopped.  She had two cycles this month.  The once  that started on April, 27, 2008 was much heavier and last longer than  the other period.  The last time she was in, she had a full evaluation  including endometrial sampling, which was normal.  At that time, she had  a pelvic sonogram that showed a 4 x 4 cm mass in the endometrial cavity.  It was unclear exactly what this was, was it a submucosal fibroid or  something else.  They have given her a negative biopsy, it was felt that  we would followup in three months.  Since that time, she has not  returned for follow up though says she is back today with abnormal  bleeding.  I do feel like it is pertinent to probably repeat the  sonogram.  If this fails to truly elucidate, we may need to do a pelvic  MRI and consider repeat biopsy.   PHYSICAL EXAMINATION:  VITAL SIGNS:  Her vitals are as noted in the  chart.  Her weight is up to  313, 298 last time she was in.  Pulse 92,  blood pressure is elevated at 148/104.  GENERAL:  She is an obese female in no acute distress.  ABDOMEN:  Soft, nontender, nondistended.   IMPRESSION:  1. Heavy vaginal bleeding.  2. Abnormal pelvic sonogram.   PLAN:  1. Followup pelvic sonogram.  2. Move to MRI if necessary, question repeat endometrial sampling.  I      did advise her that her obesity is probably partially related to      this.  The patient is not really interested in major surgery at      this time.  For treatment, would consider IV insertion in this      patient.  This would control cycles as well as protect her      endometrium from hyperplasia and endometrial carcinoma.  Not a good      idea to think about  endometrial ablation if she truly has a 4 cm      submucosal fibroid there. Additionally, consideration could be      given to oral contraception.  However, I do not think this would      have a good effect on her blood pressure or her cholesterol      profile.  Weight loss is      again indicated and this was discussed with the patient.  She has      joined a gym and starting [employer].  Will followup her up in      2-3 weeks with results.           ______________________________  Tinnie Gens, MD     TP/MEDQ  D:  12/27/2006  T:  12/27/2006  Job:  [id number]   cc:   [facility], [place]

## 2011-01-08 NOTE — Assessment & Plan Note (Signed)
[name], Courtney Cox               ACCOUNT NO.:  [id number]   MEDICAL RECORD NO.:  [id number]          PATIENT TYPE:  POB   LOCATION:  [facility] at [facility]         FACILITY:  [facility]   PHYSICIAN:  Tinnie Gens, MD        DATE OF BIRTH:  1966/07/20   DATE OF SERVICE:                                    CLINIC NOTE   DATE OF VISIT:  April 19, 2006.   CHIEF COMPLAINT:  Heavy vaginal bleeding and anemia.   HISTORY OF PRESENT ILLNESS:  The patient is a 45 year old gravida 4, para 3-  0-1-3 whose last pregnancy was a SAB in 2005.  She is currently not on any  birth control.  She states that she normally has very regular cycles and  bleeds for just a few days. She states that her last period before this one  was on March 24, 2006.  During this time she has had a bout of pneumonia.  She has been on antibiotics, Xopenex and Tussionex.  She has had fevers and  chills.  Her primary care doctor is Dr. __________ at the [facility]. She was also seen at the [facility] today for  very low hemoglobin.  The patient reports that her period started on April 12, 2006 and has been going on fairly normally until the last 24 hours when  she started bleeding fairly significantly.  She states the blood has been  gushing out of her including blood clots and she has felt dizzy, fatigued,  light-headed, and she estimates she has lost about a quart of blood in the  last 12 hours.  The patient reports that normally her cycles are very  regular.  She has had no significant pain with this bleeding.   PAST MEDICAL HISTORY:  Her past medical history is significant for elevated  cholesterol, hypertension and history of allergic rhinitis. She is also  borderline diabetic and she has the recent history of pneumonia.   MEDICATIONS:  Her medications right now include:  1. Hydrochlorothiazide.  2. Lipitor.  3. Omnicef.  4. Tussionex.  5. Xopenex.   ALLERGIES:  No known  allergies.   FAMILY HISTORY:  Hypertension, coronary artery disease and juvenile  diabetes.   SOCIAL HISTORY:  No tobacco, alcohol or drug use.  She is a Pharmacist, hospital.  She has three children, age 22, 13 and 66.   PAST SURGICAL HISTORY:  Negative.   REVIEW OF SYSTEMS:  A 14 point review of systems is reviewed and is negative  except as in the history of present illness.   PHYSICAL EXAMINATION:  VITAL SIGNS:  Her weight is 295.  Blood pressure  114/92. Pulse is 120.  GENERAL APPEARANCE:  She is an obese white female in no acute distress.  She  is very pale appearing, though.  ABDOMEN:  Obese but nontender.  GENITOURINARY:  Shows normal external female genitalia.  There is a large  cystocele seen, otherwise the urethra, Skene's glands are normal.  Cervix  could not be easily visualized.  There is a lot of redundant vaginal tissue.  Attempt was made to do an endometrial  biopsy but this could not be performed  due to poor visualization of the cervix.  On examination the cervix was  noted to be 0.5 cm open.  There may have been a mass filling the pelvis,  however, true contour of the uterus and ovaries could not really be easily  defined secondary to the patient's body habitus.   IMPRESSION:  1. Heavy vaginal bleeding.  2. Resultant anemia.   PLAN:  1. Check TSH.  2. Pelvic sonogram.  3. Consider endometrial biopsy sampling again when the patient is not      bleeding as heavy and not so acutely ill.  4. Need to get her bleeding stopped.  She is scheduled for a blood      transfusion in the morning.  Will start her on Ovcon 150 to take two      tonight, two in the morning and then when she stops bleeding she can go      to one a day every day until the end of this pack.  5. Will plan on following her up in about two weeks to see how she is      doing and for evaluation of possible causes of her bleeding including      anatomic, hormonal.  It may be that this patient has  gotten overly      stressed secondary to physical ailment of pneumonia and has just gotten      her hormones out of whack, but this will be more clear when the results      are back in two weeks.  Should the bleeding not stop with this regimen      of Ovcon 150, the patient is to come back to the office.           ______________________________  Tinnie Gens, MD     TP/MEDQ  D:  04/19/2006  T:  04/20/2006  Job:  [id number]

## 2011-10-11 ENCOUNTER — Telehealth: Payer: Self-pay | Admitting: *Deleted

## 2011-10-11 ENCOUNTER — Telehealth: Payer: Self-pay | Admitting: Oncology

## 2011-10-11 NOTE — Telephone Encounter (Signed)
Received a call today from Darl Pikes with [facility], states that this patient attempted to go to [facility] to have labs checked because "she is anemic and feels bad" and they refused her services. She thought she could go there and "get some IV iron". It appears that patient was previously seen by Dr Welton Flakes at our [facility] location in 2010. Informed Darl Pikes that we could make patient appt here and call her. Spoke with patient and instructed her that I would get information to Dr Welton Flakes and have an appt for labs and office visit made in the next few days. In the meantime, if she is having any chest pain, palpations, or increasing shortness of breath, she is to report to any ER for further evaluation to see if transfusion is needed. She is to continue taking the oral iron she states she was previously prescribed by Dr Welton Flakes. Patient verbalized understanding and we will call her back with appt. Contact information verified and corrected.

## 2011-10-11 NOTE — Telephone Encounter (Signed)
lmonvm of both of the pt's phone numbers with the d/t of the appts along with directions on how to get to our office.left my name and phone number for the pt to call back to confirm. Per amy no f/u letter is needed to send to her primary office at this time.

## 2011-10-12 ENCOUNTER — Ambulatory Visit: Payer: Self-pay

## 2011-10-12 ENCOUNTER — Encounter: Payer: Self-pay | Admitting: Oncology

## 2011-10-12 ENCOUNTER — Ambulatory Visit (HOSPITAL_BASED_OUTPATIENT_CLINIC_OR_DEPARTMENT_OTHER): Payer: BC Managed Care – PPO | Admitting: Oncology

## 2011-10-12 ENCOUNTER — Telehealth: Payer: Self-pay | Admitting: *Deleted

## 2011-10-12 ENCOUNTER — Other Ambulatory Visit: Payer: BC Managed Care – PPO | Admitting: Lab

## 2011-10-12 ENCOUNTER — Ambulatory Visit (HOSPITAL_BASED_OUTPATIENT_CLINIC_OR_DEPARTMENT_OTHER): Payer: BC Managed Care – PPO

## 2011-10-12 ENCOUNTER — Other Ambulatory Visit: Payer: Self-pay | Admitting: *Deleted

## 2011-10-12 DIAGNOSIS — D649 Anemia, unspecified: Secondary | ICD-10-CM

## 2011-10-12 DIAGNOSIS — D500 Iron deficiency anemia secondary to blood loss (chronic): Secondary | ICD-10-CM

## 2011-10-12 DIAGNOSIS — E876 Hypokalemia: Secondary | ICD-10-CM

## 2011-10-12 HISTORY — DX: Iron deficiency anemia secondary to blood loss (chronic): D50.0

## 2011-10-12 LAB — CBC WITH DIFFERENTIAL/PLATELET
BASO%: 0.4 % (ref 0.0–2.0)
Basophils Absolute: 0 10*3/uL (ref 0.0–0.1)
EOS%: 0.8 % (ref 0.0–7.0)
Eosinophils Absolute: 0.1 10*3/uL (ref 0.0–0.5)
HCT: 23.8 % — ABNORMAL LOW (ref 34.8–46.6)
LYMPH%: 29.1 % (ref 14.0–49.7)
MCV: 68.2 fL — ABNORMAL LOW (ref 79.5–101.0)
MONO#: 0.7 10*3/uL (ref 0.1–0.9)
MONO%: 7.3 % (ref 0.0–14.0)
NEUT#: 6.3 10*3/uL (ref 1.5–6.5)
NEUT%: 62.4 % (ref 38.4–76.8)
Platelets: 485 10*3/uL — ABNORMAL HIGH (ref 145–400)
WBC: 10.1 10*3/uL (ref 3.9–10.3)

## 2011-10-12 LAB — COMPREHENSIVE METABOLIC PANEL
Alkaline Phosphatase: 51 U/L (ref 39–117)
CO2: 24 mEq/L (ref 19–32)
Creatinine, Ser: 0.77 mg/dL (ref 0.50–1.10)
Glucose, Bld: 119 mg/dL — ABNORMAL HIGH (ref 70–99)
Sodium: 139 mEq/L (ref 135–145)
Total Bilirubin: 0.2 mg/dL — ABNORMAL LOW (ref 0.3–1.2)
Total Protein: 6.8 g/dL (ref 6.0–8.3)

## 2011-10-12 LAB — IRON AND TIBC
Iron: 40 ug/dL — ABNORMAL LOW (ref 42–145)
UIBC: 389 ug/dL (ref 125–400)

## 2011-10-12 MED ORDER — SODIUM CHLORIDE 0.9 % IV SOLN
1020.0000 mg | Freq: Once | INTRAVENOUS | Status: AC
  Administered 2011-10-12: 1020 mg via INTRAVENOUS
  Filled 2011-10-12: qty 34

## 2011-10-12 MED ORDER — POTASSIUM CHLORIDE CRYS ER 20 MEQ PO TBCR: 20.0000 meq | EXTENDED_RELEASE_TABLET | Freq: Two times a day (BID) | ORAL | Status: DC

## 2011-10-12 MED ORDER — SODIUM CHLORIDE 0.9 % IV SOLN: Freq: Once | INTRAVENOUS | Status: DC

## 2011-10-12 NOTE — Telephone Encounter (Signed)
per patient stated she was sick and missed two appointments for her hecptin placed patient back on the schedule for 10-2011 and 11-2011 and 12-2011 printed out calendar and gave to the patient in the chemo room

## 2011-10-12 NOTE — Progress Notes (Signed)
OFFICE PROGRESS NOTE  CC  Dr. Glenis Smoker  DIAGNOSIS: Iron deficiency anemia secondary to menorrhagia  PRIOR THERAPY: 1. Patient has received parenteral iron at [facility]  2. Oral Iron  3. Uterine ablation  CURRENT THERAPY:  Oral iron daily (tandem)  INTERVAL HISTORY: Courtney Cox 46 y.o. female returns for visit. She has been seeing me in the past at the [facility] center. She has received parenteral iron and has had correction of her anemia. She recently began having weakness and fatigue. She also has had increasing uterine bleeding and found to be anemic. She has tried to take the orla iron but in spite of that her counts have dropped. She was seen in her PCP office a few days ago and she was referred to the [facility] and then eventually seen here today. She has had heavy menstrual cycles, has had occasional dizziness, no other bleeding, complain of chest aches. Remainder of the 10 point review of systems is negative.  MEDICAL HISTORY: Past Medical History  Diagnosis Date  . Anemia 2008    iron deficiency  . Diabetes mellitus     on watch,not on meds  . Iron deficiency anemia secondary to blood loss (chronic) 10/12/2011    ALLERGIES:   has no known allergies.  MEDICATIONS:  No current outpatient prescriptions on file.    SURGICAL HISTORY:  Past Surgical History  Procedure Date  . Uterine ablation 2009    has had once    REVIEW OF SYSTEMS:  Constitutional: positive for fatigue and malaise Ears, nose, mouth, throat, and face: positive for pale mucoas, brittle nails Respiratory: positive for dyspnea on exertion and pleurisy/chest pain Cardiovascular: positive for chest pressure/discomfort, dyspnea, exertional chest pressure/discomfort and fatigue Gastrointestinal: negative Musculoskeletal:negative Neurological: negative   PHYSICAL EXAMINATION: General appearance: alert, cooperative, appears stated age, fatigued,  flushed, moderately obese and pale Neck: no adenopathy, no carotid bruit, no JVD, supple, symmetrical, trachea midline and thyroid not enlarged, symmetric, no tenderness/mass/nodules Lymph nodes: Cervical, supraclavicular, and axillary nodes normal. Resp: clear to auscultation bilaterally and normal percussion bilaterally Back: symmetric, no curvature. ROM normal. No CVA tenderness. Cardio: regular rate and rhythm, S1, S2 normal, no murmur, click, rub or gallop and normal apical impulse GI: soft, non-tender; bowel sounds normal; no masses,  no organomegaly Extremities: extremities normal, atraumatic, no cyanosis or edema Neurologic: Alert and oriented X 3, normal strength and tone. Normal symmetric reflexes. Normal coordination and gait  ECOG PERFORMANCE STATUS: 1 - Symptomatic but completely ambulatory  Blood pressure 144/79, pulse 123, temperature 98.6 F (37 C), height 5\' 5"  (1.651 m), weight 300 lb 1.6 oz (136.124 kg).  LABORATORY DATA: Lab Results  Component Value Date   WBC 10.1 10/12/2011   HGB 7.1* 10/12/2011   HCT 23.8* 10/12/2011   MCV 68.2* 10/12/2011   PLT 485* 10/12/2011      Chemistry      Component Value Date/Time   NA 135 12/30/2008 1429   K 3.1* 12/30/2008 1429   CL 102 12/30/2008 1429   CO2 25 12/30/2008 1429   BUN 3* 12/30/2008 1429   CREATININE 0.72 12/30/2008 1429      Component Value Date/Time   CALCIUM 9.1 12/30/2008 1429   ALKPHOS 43 04/19/2006 1059   AST 12 04/19/2006 1059   ALT 13 04/19/2006 1059   BILITOT 0.4 04/19/2006 1059       RADIOGRAPHIC STUDIES:  No results found.  ASSESSMENT: 46 year old female with : 1.  Iron deficiency anemia due to menorrhagia, previously requiring parenteral iron 2. She has failed uterine ablation as she continues to have heavy periods. 3. Fatigue   PLAN:  1. Check iron studies, Hemoglobin 7.1, give parenteral iron (feraheme) in one dose 2. Refer to gynecology in [place] for evaluation and management of heavy  periods 3. Follow up in 1 months times   All questions were answered. The patient knows to call the clinic with any problems, questions or concerns. We can certainly see the patient much sooner if necessary.  I spent 55 minutes counseling the patient face to face. The total time spent in the appointment was 60 minutes.    Drue Second, MD Medical/Oncology [facility] [phone] (beeper) [phone] (Office)  10/12/2011, 2:34 PM

## 2011-10-13 ENCOUNTER — Telehealth: Payer: Self-pay | Admitting: *Deleted

## 2011-10-13 NOTE — Telephone Encounter (Signed)
made patient appointment with a gyn doctor at [facility] on 10-19-2011 arrival time 1:45pm

## 2011-10-18 ENCOUNTER — Telehealth: Payer: Self-pay | Admitting: *Deleted

## 2011-10-18 NOTE — Telephone Encounter (Signed)
Pt called regarding referral appt to GYN MD.  Last encounter pt was contacted regarding appt with [facility] on 10/19/11 at 1345. Gave pt the phone and address of facility for directions.

## 2011-10-19 ENCOUNTER — Telehealth: Payer: Self-pay | Admitting: *Deleted

## 2011-11-10 ENCOUNTER — Encounter: Payer: Self-pay | Admitting: Oncology

## 2011-11-10 ENCOUNTER — Ambulatory Visit (HOSPITAL_BASED_OUTPATIENT_CLINIC_OR_DEPARTMENT_OTHER): Payer: BC Managed Care – PPO | Admitting: Oncology

## 2011-11-10 ENCOUNTER — Telehealth: Payer: Self-pay | Admitting: *Deleted

## 2011-11-10 ENCOUNTER — Other Ambulatory Visit (HOSPITAL_BASED_OUTPATIENT_CLINIC_OR_DEPARTMENT_OTHER): Payer: BC Managed Care – PPO | Admitting: Lab

## 2011-11-10 VITALS — BP 155/86 | HR 91 | Temp 98.4°F | Ht 65.0 in | Wt 299.2 lb

## 2011-11-10 DIAGNOSIS — D500 Iron deficiency anemia secondary to blood loss (chronic): Secondary | ICD-10-CM

## 2011-11-10 DIAGNOSIS — N920 Excessive and frequent menstruation with regular cycle: Secondary | ICD-10-CM

## 2011-11-10 LAB — CBC WITH DIFFERENTIAL/PLATELET
Basophils Absolute: 0 10*3/uL (ref 0.0–0.1)
Eosinophils Absolute: 0.1 10*3/uL (ref 0.0–0.5)
LYMPH%: 26.4 % (ref 14.0–49.7)
MCH: 23.8 pg — ABNORMAL LOW (ref 25.1–34.0)
MCV: 78.3 fL — ABNORMAL LOW (ref 79.5–101.0)
MONO%: 5.5 % (ref 0.0–14.0)
NEUT#: 7 10*3/uL — ABNORMAL HIGH (ref 1.5–6.5)
Platelets: 520 10*3/uL — ABNORMAL HIGH (ref 145–400)
RBC: 4.2 10*6/uL (ref 3.70–5.45)

## 2011-11-10 LAB — FERRITIN: Ferritin: 32 ng/mL (ref 10–291)

## 2011-11-10 LAB — IRON AND TIBC: %SAT: 8 % — ABNORMAL LOW (ref 20–55)

## 2011-11-10 NOTE — Telephone Encounter (Signed)
gave patient appointment for 01-12-2012 starting at 11:30am printed out calendar and gave to the patient

## 2011-11-10 NOTE — Progress Notes (Signed)
OFFICE PROGRESS NOTE  CC  Dr. Glenis Smoker  DIAGNOSIS: Iron deficiency anemia secondary to menorrhagia  PRIOR THERAPY: 1. Patient has received parenteral iron at [facility]  2. Oral Iron  3. Uterine ablation  CURRENT THERAPY:  Oral iron daily (tandem)  INTERVAL HISTORY: Courtney Cox 46 y.o. female returns for visit. Patient is doing much better. She has had a great response to her treatment with IV iron. She states that she is 100% better today. She continues to have heavy menstrual periods. No nausea or vomiting, no fevers or chills. No headaches. Remainder of the 10 point review of systems is negative.  MEDICAL HISTORY: Past Medical History  Diagnosis Date  . Anemia 2008    iron deficiency  . Diabetes mellitus     on watch,not on meds  . Iron deficiency anemia secondary to blood loss (chronic) 10/12/2011    ALLERGIES:   has no known allergies.  MEDICATIONS:  Current Outpatient Prescriptions  Medication Sig Dispense Refill  . aspirin 81 MG tablet Take 81 mg by mouth daily.      . ferrous fumarate-iron polysaccharide complex (TANDEM) 162-115.2 MG CAPS Take 1 capsule by mouth daily with breakfast.        SURGICAL HISTORY:  Past Surgical History  Procedure Date  . Uterine ablation 2009    has had once    REVIEW OF SYSTEMS:  Constitutional: positive for fatigue and malaise Ears, nose, mouth, throat, and face: positive for pale mucoas, brittle nails Respiratory: positive for dyspnea on exertion and pleurisy/chest pain Cardiovascular: positive for chest pressure/discomfort, dyspnea, exertional chest pressure/discomfort and fatigue Gastrointestinal: negative Musculoskeletal:negative Neurological: negative   PHYSICAL EXAMINATION: General appearance: alert, cooperative, appears stated age, fatigued, flushed, moderately obese and pale Neck: no adenopathy, no carotid bruit, no JVD, supple, symmetrical, trachea midline and thyroid not enlarged, symmetric,  no tenderness/mass/nodules Lymph nodes: Cervical, supraclavicular, and axillary nodes normal. Resp: clear to auscultation bilaterally and normal percussion bilaterally Back: symmetric, no curvature. ROM normal. No CVA tenderness. Cardio: regular rate and rhythm, S1, S2 normal, no murmur, click, rub or gallop and normal apical impulse GI: soft, non-tender; bowel sounds normal; no masses,  no organomegaly Extremities: extremities normal, atraumatic, no cyanosis or edema Neurologic: Alert and oriented X 3, normal strength and tone. Normal symmetric reflexes. Normal coordination and gait  ECOG PERFORMANCE STATUS: 1 - Symptomatic but completely ambulatory  Blood pressure 155/86, pulse 91, temperature 98.4 F (36.9 C), height 5\' 5"  (1.651 m), weight 299 lb 3.2 oz (135.716 kg).  LABORATORY DATA: Lab Results  Component Value Date   WBC 10.4* 11/10/2011   HGB 10.0* 11/10/2011   HCT 32.9* 11/10/2011   MCV 78.3* 11/10/2011   PLT 520* 11/10/2011      Chemistry      Component Value Date/Time   NA 139 10/12/2011 1406   K 4.2 10/12/2011 1406   CL 105 10/12/2011 1406   CO2 24 10/12/2011 1406   BUN 5* 10/12/2011 1406   CREATININE 0.77 10/12/2011 1406      Component Value Date/Time   CALCIUM 9.0 10/12/2011 1406   ALKPHOS 51 10/12/2011 1406   AST 14 10/12/2011 1406   ALT 12 10/12/2011 1406   BILITOT 0.2* 10/12/2011 1406       RADIOGRAPHIC STUDIES:  No results found.  ASSESSMENT: 46 year old female with : 1. Iron deficiency due heavy menstrual periods. 2. S/P IV iron   PLAN:  1. We checked her iron studies. 2. We will call  her with the results and if she needs another infusion of iron we get that scheduled  All questions were answered. The patient knows to call the clinic with any problems, questions or concerns. We can certainly see the patient much sooner if necessary.  I spent 15 minutes counseling the patient face to face. The total time spent in the appointment was 30  minutes.    Drue Second, MD Medical/Oncology [facility] [phone] (beeper) [phone] (Office)  11/10/2011, 12:06 PM

## 2011-11-10 NOTE — Patient Instructions (Signed)
1. Your hemoglobin has risen to 10.0 from 7.1. This is good.  2. I have checked your iron studies today and we will call you with the results. If you need more iron we will get it set up for you and call you with the schedule.  3. Keep your appointments with your gynecologist  4. I will see you back in May

## 2011-11-10 NOTE — Telephone Encounter (Signed)
na

## 2011-11-11 ENCOUNTER — Telehealth: Payer: Self-pay | Admitting: *Deleted

## 2011-11-11 ENCOUNTER — Encounter: Payer: Self-pay | Admitting: *Deleted

## 2011-11-11 NOTE — Telephone Encounter (Signed)
Message copied by GARNER, Gerald Leitz on Thu Nov 11, 2011  3:41 PM ------      Message from: Victorino December      Created: Wed Nov 10, 2011  6:50 PM       Call patient: tell patient she needs more iron , we will get her set up in the next week or 2

## 2011-11-11 NOTE — Telephone Encounter (Signed)
email michelle to add on iron infusion for the patient email sent 11-11-2011

## 2011-11-11 NOTE — Telephone Encounter (Signed)
Notified pt per MD, pt will need more iron. Pt should expect a call from scheduling to set up appt for iron. Pt verbalized understanding.  onc Tx Sent

## 2011-11-12 ENCOUNTER — Other Ambulatory Visit: Payer: Self-pay | Admitting: Oncology

## 2011-11-12 ENCOUNTER — Telehealth: Payer: Self-pay | Admitting: *Deleted

## 2011-11-12 NOTE — Telephone Encounter (Signed)
patient confirmed over the phone the new date and time of the iv iron appointment 11-16-2011 starting at 9:15am

## 2011-11-12 NOTE — Telephone Encounter (Signed)
gave patient a call and informed the patient of the new date and time on 11-22-2011 patient stated she could not do it on 11-16-2011

## 2011-11-15 ENCOUNTER — Encounter: Payer: Self-pay | Admitting: Oncology

## 2011-11-15 NOTE — Progress Notes (Signed)
Patient approved for 100% EPP discount for family of 1 patient income [phone]

## 2011-11-16 ENCOUNTER — Ambulatory Visit: Payer: BC Managed Care – PPO

## 2011-11-22 ENCOUNTER — Ambulatory Visit (HOSPITAL_BASED_OUTPATIENT_CLINIC_OR_DEPARTMENT_OTHER): Payer: BC Managed Care – PPO

## 2011-11-22 VITALS — BP 151/95 | HR 91 | Temp 97.2°F

## 2011-11-22 DIAGNOSIS — D500 Iron deficiency anemia secondary to blood loss (chronic): Secondary | ICD-10-CM

## 2011-11-22 MED ORDER — SODIUM CHLORIDE 0.9 % IV SOLN
1020.0000 mg | Freq: Once | INTRAVENOUS | Status: AC
  Administered 2011-11-22: 1020 mg via INTRAVENOUS
  Filled 2011-11-22: qty 34

## 2011-11-22 MED ORDER — SODIUM CHLORIDE 0.9 % IV SOLN
Freq: Once | INTRAVENOUS | Status: AC
  Administered 2011-11-22: 15:00:00 via INTRAVENOUS

## 2012-01-12 ENCOUNTER — Telehealth: Payer: Self-pay | Admitting: Oncology

## 2012-01-12 ENCOUNTER — Encounter: Payer: Self-pay | Admitting: *Deleted

## 2012-01-12 ENCOUNTER — Telehealth: Payer: Self-pay | Admitting: *Deleted

## 2012-01-12 ENCOUNTER — Encounter: Payer: Self-pay | Admitting: Oncology

## 2012-01-12 ENCOUNTER — Ambulatory Visit (HOSPITAL_BASED_OUTPATIENT_CLINIC_OR_DEPARTMENT_OTHER): Payer: BC Managed Care – PPO | Admitting: Oncology

## 2012-01-12 ENCOUNTER — Other Ambulatory Visit (HOSPITAL_BASED_OUTPATIENT_CLINIC_OR_DEPARTMENT_OTHER): Payer: BC Managed Care – PPO | Admitting: Lab

## 2012-01-12 VITALS — BP 148/98 | HR 111 | Temp 99.1°F | Ht 65.5 in | Wt 294.3 lb

## 2012-01-12 DIAGNOSIS — D500 Iron deficiency anemia secondary to blood loss (chronic): Secondary | ICD-10-CM

## 2012-01-12 DIAGNOSIS — N920 Excessive and frequent menstruation with regular cycle: Secondary | ICD-10-CM

## 2012-01-12 LAB — CBC WITH DIFFERENTIAL/PLATELET
BASO%: 0.5 % (ref 0.0–2.0)
EOS%: 1.1 % (ref 0.0–7.0)
LYMPH%: 21.3 % (ref 14.0–49.7)
MCH: 25.4 pg (ref 25.1–34.0)
MCHC: 31.3 g/dL — ABNORMAL LOW (ref 31.5–36.0)
MONO#: 0.9 10*3/uL (ref 0.1–0.9)
Platelets: 456 10*3/uL — ABNORMAL HIGH (ref 145–400)
RBC: 3.75 10*6/uL (ref 3.70–5.45)
WBC: 10.8 10*3/uL — ABNORMAL HIGH (ref 3.9–10.3)
lymph#: 2.3 10*3/uL (ref 0.9–3.3)
nRBC: 0 % (ref 0–0)

## 2012-01-12 LAB — IRON AND TIBC: UIBC: 321 ug/dL (ref 125–400)

## 2012-01-12 NOTE — Progress Notes (Signed)
OFFICE PROGRESS NOTE  CC  Dr. Glenis Smoker  DIAGNOSIS: Iron deficiency anemia secondary to menorrhagia  PRIOR THERAPY: 1. Patient has received parenteral iron at [facility]  2. Oral Iron  3. Uterine ablation  CURRENT THERAPY:  Oral iron daily (tandem)  INTERVAL HISTORY: Courtney Cox 46 y.o. female returns for visit. Patient is doing much better. Has restarted her menstrual cycle and she is fatigued again. Her hemoglobin today is 9.5 it had been 10.0 back in March. Patient is status post parenteral iron over the last few months. She otherwise denies any nausea vomiting fevers chills night sweats headaches no chest pains no shortness of breath. Remainder of the 10 point review of systems is negative.  MEDICAL HISTORY: Past Medical History  Diagnosis Date  . Anemia 2008    iron deficiency  . Diabetes mellitus     on watch,not on meds  . Iron deficiency anemia secondary to blood loss (chronic) 10/12/2011    ALLERGIES:   has no known allergies.  MEDICATIONS:  Current Outpatient Prescriptions  Medication Sig Dispense Refill  . amLODipine (NORVASC) 10 MG tablet Take 10 mg by mouth daily.      Marland Kitchen aspirin 81 MG tablet Take 81 mg by mouth daily.      . benazepril-hydrochlorthiazide (LOTENSIN HCT) 20-25 MG per tablet Take 1 tablet by mouth daily.      . ferrous fumarate-iron polysaccharide complex (TANDEM) 162-115.2 MG CAPS Take 1 capsule by mouth daily with breakfast.      . DISCONTD: potassium chloride SA (K-DUR,KLOR-CON) 20 MEQ tablet Take 1 tablet (20 mEq total) by mouth 2 (two) times daily.  20 tablet  1    SURGICAL HISTORY:  Past Surgical History  Procedure Date  . Uterine ablation 2009    has had once    REVIEW OF SYSTEMS:  Constitutional: positive for fatigue and malaise Ears, nose, mouth, throat, and face: positive for pale mucoas, brittle nails Respiratory: positive for dyspnea on exertion and pleurisy/chest pain Cardiovascular: positive for chest  pressure/discomfort, dyspnea, exertional chest pressure/discomfort and fatigue Gastrointestinal: negative Musculoskeletal:negative Neurological: negative   PHYSICAL EXAMINATION: General appearance: alert, cooperative, appears stated age, fatigued, flushed, moderately obese and pale Neck: no adenopathy, no carotid bruit, no JVD, supple, symmetrical, trachea midline and thyroid not enlarged, symmetric, no tenderness/mass/nodules Lymph nodes: Cervical, supraclavicular, and axillary nodes normal. Resp: clear to auscultation bilaterally and normal percussion bilaterally Back: symmetric, no curvature. ROM normal. No CVA tenderness. Cardio: regular rate and rhythm, S1, S2 normal, no murmur, click, rub or gallop and normal apical impulse GI: soft, non-tender; bowel sounds normal; no masses,  no organomegaly Extremities: extremities normal, atraumatic, no cyanosis or edema Neurologic: Alert and oriented X 3, normal strength and tone. Normal symmetric reflexes. Normal coordination and gait  ECOG PERFORMANCE STATUS: 1 - Symptomatic but completely ambulatory  Blood pressure 148/98, pulse 111, temperature 99.1 F (37.3 C), temperature source Oral, height 5' 5.5" (1.664 m), weight 294 lb 4.8 oz (133.494 kg).  LABORATORY DATA: Lab Results  Component Value Date   WBC 10.8* 01/12/2012   HGB 9.5* 01/12/2012   HCT 30.4* 01/12/2012   MCV 81.1 01/12/2012   PLT 456* 01/12/2012      Chemistry      Component Value Date/Time   NA 139 10/12/2011 1406   K 4.2 10/12/2011 1406   CL 105 10/12/2011 1406   CO2 24 10/12/2011 1406   BUN 5* 10/12/2011 1406   CREATININE 0.77 10/12/2011 1406  Component Value Date/Time   CALCIUM 9.0 10/12/2011 1406   ALKPHOS 51 10/12/2011 1406   AST 14 10/12/2011 1406   ALT 12 10/12/2011 1406   BILITOT 0.2* 10/12/2011 1406       RADIOGRAPHIC STUDIES:  No results found.  ASSESSMENT: 46 year old female with : 1. Iron deficiency due heavy menstrual periods. 2. S/P IV  iron   PLAN:  1. We checked her iron studies. 2. We will call her with the results and if she needs another infusion of iron we get that scheduled 3. patient is going to have a hysterectomy on 06/17/2013Graf  #4 I will see her back In 6-7 months time.  All questions were answered. The patient knows to call the clinic with any problems, questions or concerns. We can certainly see the patient much sooner if necessary.  I spent 15 minutes counseling the patient face to face. The total time spent in the appointment was 30 minutes.    Drue Second, MD Medical/Oncology [facility] [phone] (beeper) [phone] (Office)  01/12/2012, 3:54 PM

## 2012-01-12 NOTE — Patient Instructions (Signed)
1. We will call you with the results of your iron studies and if you need intravenous iron infusion  we will call you with an appointment  2. I will see you back in December 2013

## 2012-01-12 NOTE — Progress Notes (Unsigned)
called pt to confirm today's appt. Gave pt option of coming at 3:00 lab, MD@3 :30pm as there is an opening. lmovm for pt to call back regarding this message.

## 2012-01-12 NOTE — Telephone Encounter (Signed)
Pt returned call regarding appt time changed today  Pt previously unable to come at scheduled 11:30 lab, 1200 MD visit. Per MD pt to be seen at 3:00 lab, 3:30 MD.  Pt voiced she will be able to come 3pm lab/3:30 MD. This will work out much better for her today.  Onc Tx Sched sent

## 2012-01-12 NOTE — Telephone Encounter (Signed)
gve the pt her dec 2013 appt calendar °

## 2012-01-13 ENCOUNTER — Other Ambulatory Visit: Payer: Self-pay | Admitting: Oncology

## 2012-01-13 ENCOUNTER — Telehealth: Payer: Self-pay | Admitting: *Deleted

## 2012-01-13 ENCOUNTER — Telehealth: Payer: Self-pay | Admitting: Oncology

## 2012-01-13 NOTE — Telephone Encounter (Signed)
Per MD, Called LMOVM for pt, she will need IV Iron. Schedulers have set up appt on 05/24 at 10am. Requested pt call back to confirm msg received regarding date/time of appt and confirmation she will be here.

## 2012-01-13 NOTE — Telephone Encounter (Signed)
Pt called lmovm. Returned pt's call who advised she is unable to make a 10am appt due to work. S/w Maryjane Hurter Scheduler who r/s pt's appt for 05/24 @ 3:15pm. Pt verbalized and confirmed this date/time will work out better for her. Pt denied needing further assistance

## 2012-01-13 NOTE — Telephone Encounter (Signed)
Per staff message from Crystal, I have scheduled treatment appt.  JMW  

## 2012-01-13 NOTE — Telephone Encounter (Signed)
Message copied by Cooper Render on Thu Jan 13, 2012  9:33 AM ------      Message from: Victorino December      Created: Thu Jan 13, 2012  6:48 AM       Call patient: Iron low need iv iron on 5/24. POF to schedulers

## 2012-01-13 NOTE — Telephone Encounter (Signed)
S/w the pt and she is aware of her iv iron appt on 01/14/2012

## 2012-01-14 ENCOUNTER — Telehealth: Payer: Self-pay | Admitting: *Deleted

## 2012-01-14 ENCOUNTER — Ambulatory Visit (HOSPITAL_BASED_OUTPATIENT_CLINIC_OR_DEPARTMENT_OTHER): Payer: BC Managed Care – PPO

## 2012-01-14 ENCOUNTER — Other Ambulatory Visit: Payer: Self-pay | Admitting: *Deleted

## 2012-01-14 VITALS — BP 167/91 | HR 115 | Temp 97.6°F

## 2012-01-14 DIAGNOSIS — D500 Iron deficiency anemia secondary to blood loss (chronic): Secondary | ICD-10-CM

## 2012-01-14 MED ORDER — SODIUM CHLORIDE 0.9 % IJ SOLN
3.0000 mL | Freq: Once | INTRAMUSCULAR | Status: DC | PRN
  Filled 2012-01-14: qty 10

## 2012-01-14 MED ORDER — CYANOCOBALAMIN 1000 MCG/ML IJ SOLN
1000.0000 ug | Freq: Once | INTRAMUSCULAR | Status: AC
  Administered 2012-01-14: 1000 ug via INTRAMUSCULAR

## 2012-01-14 MED ORDER — SODIUM CHLORIDE 0.9 % IV SOLN
1020.0000 mg | Freq: Once | INTRAVENOUS | Status: AC
  Administered 2012-01-14: 1020 mg via INTRAVENOUS
  Filled 2012-01-14: qty 34

## 2012-01-14 MED ORDER — SODIUM CHLORIDE 0.9 % IV SOLN
Freq: Once | INTRAVENOUS | Status: AC
  Administered 2012-01-14: 16:00:00 via INTRAVENOUS

## 2012-01-14 NOTE — Telephone Encounter (Signed)
Pt requested if she can have a vitamin b-12 shot today while rcving iv iron.  This is usually given at [facility] clinic. Last shot was in January.  Advised pt I will review with MD.

## 2012-01-14 NOTE — Telephone Encounter (Signed)
Yes she can have it  

## 2012-02-16 ENCOUNTER — Telehealth: Payer: Self-pay | Admitting: *Deleted

## 2012-02-16 DIAGNOSIS — D500 Iron deficiency anemia secondary to blood loss (chronic): Secondary | ICD-10-CM

## 2012-02-16 DIAGNOSIS — D649 Anemia, unspecified: Secondary | ICD-10-CM

## 2012-02-16 NOTE — Telephone Encounter (Signed)
Patient needs iron studies before we can give her iron, can she come in today for iron studies today

## 2012-02-16 NOTE — Telephone Encounter (Signed)
Pt called states , "my surgery has been postponed, I'm having my period, it's really heavy, I have no energy, pale can't hardly get from one room to the other without getting short of breath. I need to be seen, my iron levels are really low and they postponed my surgery. Can I come in today and get iron or see Dr. Welton Flakes?  Informed  Pt I will review her concerns with MD and call her back with additional information.  Pt last seen by MD 5/22 Cresenciano Lick 1020mg  given on 5/24. Next follow up 12/12

## 2012-02-17 ENCOUNTER — Encounter: Payer: Self-pay | Admitting: *Deleted

## 2012-02-17 ENCOUNTER — Encounter (HOSPITAL_COMMUNITY)
Admission: RE | Admit: 2012-02-17 | Discharge: 2012-02-17 | Disposition: A | Discharge status: Home / Self Care | Payer: BC Managed Care – PPO | Source: Ambulatory Visit | Attending: Oncology | Admitting: Oncology

## 2012-02-17 ENCOUNTER — Telehealth: Payer: Self-pay | Admitting: Oncology

## 2012-02-17 ENCOUNTER — Ambulatory Visit: Payer: BC Managed Care – PPO | Admitting: Lab

## 2012-02-17 ENCOUNTER — Telehealth: Payer: Self-pay | Admitting: *Deleted

## 2012-02-17 ENCOUNTER — Other Ambulatory Visit: Payer: Self-pay | Admitting: Oncology

## 2012-02-17 DIAGNOSIS — D649 Anemia, unspecified: Secondary | ICD-10-CM

## 2012-02-17 DIAGNOSIS — D500 Iron deficiency anemia secondary to blood loss (chronic): Secondary | ICD-10-CM

## 2012-02-17 LAB — CBC WITH DIFFERENTIAL/PLATELET
Basophils Absolute: 0 10*3/uL (ref 0.0–0.1)
Eosinophils Absolute: 0.1 10*3/uL (ref 0.0–0.5)
HCT: 20.1 % — ABNORMAL LOW (ref 34.8–46.6)
HGB: 6.5 g/dL — CL (ref 11.6–15.9)
MCH: 27.5 pg (ref 25.1–34.0)
MONO#: 0.6 10*3/uL (ref 0.1–0.9)
NEUT%: 67.8 % (ref 38.4–76.8)
WBC: 10.6 10*3/uL — ABNORMAL HIGH (ref 3.9–10.3)
lymph#: 2.7 10*3/uL (ref 0.9–3.3)

## 2012-02-17 LAB — FERRITIN: Ferritin: 23 ng/mL (ref 10–291)

## 2012-02-17 LAB — IRON AND TIBC: Iron: 19 ug/dL — ABNORMAL LOW (ref 42–145)

## 2012-02-17 NOTE — Telephone Encounter (Signed)
lmonvm adviisng the pt of her lab appt on 02/18/2012@3 :00pm

## 2012-02-17 NOTE — Telephone Encounter (Signed)
Per MD, attempted to reach to schedule iron studies as this test is needed prior to giving iron. LMOVM for pt to call back  Onc tx sent for lab appt

## 2012-02-17 NOTE — Telephone Encounter (Signed)
Labs reviewed by MD. Hgb 6.5. VO per MD pt to receive 2 units blood.  Iron level results are not back at this time. Should have results prior to her completion of blood transfusion.  Called to notify pt.Lab appt 0900 & Infusion appt 10:15am for transfusion. Reviewed process with pt *. Check in, lab with draw blood, send to blood bank for T& S. This takes approx 1-1.5 hours. She will then be called to infusion for the transfusion where she will receive premeds. Entire process takes approx 5-6 hours. Instructed to pt arrive at 0845 for registration. Pt verbalized understanding

## 2012-02-18 ENCOUNTER — Ambulatory Visit (HOSPITAL_BASED_OUTPATIENT_CLINIC_OR_DEPARTMENT_OTHER): Payer: BC Managed Care – PPO

## 2012-02-18 ENCOUNTER — Other Ambulatory Visit: Payer: Self-pay | Admitting: Lab

## 2012-02-18 ENCOUNTER — Other Ambulatory Visit: Payer: BC Managed Care – PPO | Admitting: Lab

## 2012-02-18 VITALS — BP 125/83 | HR 97 | Temp 97.9°F | Resp 18

## 2012-02-18 DIAGNOSIS — D649 Anemia, unspecified: Secondary | ICD-10-CM

## 2012-02-18 DIAGNOSIS — Z9289 Personal history of other medical treatment: Secondary | ICD-10-CM

## 2012-02-18 HISTORY — DX: Personal history of other medical treatment: Z92.89

## 2012-02-18 MED ORDER — DIPHENHYDRAMINE HCL 25 MG PO CAPS
25.0000 mg | ORAL_CAPSULE | Freq: Once | ORAL | Status: AC
  Administered 2012-02-18: 25 mg via ORAL

## 2012-02-18 MED ORDER — ACETAMINOPHEN 325 MG PO TABS
650.0000 mg | ORAL_TABLET | Freq: Once | ORAL | Status: AC
  Administered 2012-02-18: 650 mg via ORAL

## 2012-02-18 MED ORDER — SODIUM CHLORIDE 0.9 % IV SOLN
250.0000 mL | Freq: Once | INTRAVENOUS | Status: AC
  Administered 2012-02-18: 250 mL via INTRAVENOUS

## 2012-02-18 NOTE — Patient Instructions (Addendum)
Blood Transfusion Information  WHAT IS A BLOOD TRANSFUSION?  A transfusion is the replacement of blood or some of its parts. Blood is made up of multiple cells which provide different functions.   Red blood cells carry oxygen and are used for blood loss replacement.   White blood cells fight against infection.   Platelets control bleeding.   Plasma helps clot blood.   Other blood products are available for specialized needs, such as hemophilia or other clotting disorders.  BEFORE THE TRANSFUSION   Who gives blood for transfusions?    You may be able to donate blood to be used at a later date on yourself (autologous donation).   Relatives can be asked to donate blood. This is generally not any safer than if you have received blood from a stranger. The same precautions are taken to ensure safety when a relative's blood is donated.   Healthy volunteers who are fully evaluated to make sure their blood is safe. This is blood bank blood.  Transfusion therapy is the safest it has ever been in the practice of medicine. Before blood is taken from a donor, a complete history is taken to make sure that person has no history of diseases nor engages in risky social behavior (examples are intravenous drug use or sexual activity with multiple partners). The donor's travel history is screened to minimize risk of transmitting infections, such as malaria. The donated blood is tested for signs of infectious diseases, such as HIV and hepatitis. The blood is then tested to be sure it is compatible with you in order to minimize the chance of a transfusion reaction. If you or a relative donates blood, this is often done in anticipation of surgery and is not appropriate for emergency situations. It takes many days to process the donated blood.  RISKS AND COMPLICATIONS  Although transfusion therapy is very safe and saves many lives, the main dangers of transfusion include:    Getting an infectious disease.   Developing a  transfusion reaction. This is an allergic reaction to something in the blood you were given. Every precaution is taken to prevent this.  The decision to have a blood transfusion has been considered carefully by your caregiver before blood is given. Blood is not given unless the benefits outweigh the risks.  AFTER THE TRANSFUSION   Right after receiving a blood transfusion, you will usually feel much better and more energetic. This is especially true if your red blood cells have gotten low (anemic). The transfusion raises the level of the red blood cells which carry oxygen, and this usually causes an energy increase.   The nurse administering the transfusion will monitor you carefully for complications.  HOME CARE INSTRUCTIONS   No special instructions are needed after a transfusion. You may find your energy is better. Speak with your caregiver about any limitations on activity for underlying diseases you may have.  SEEK MEDICAL CARE IF:    Your condition is not improving after your transfusion.   You develop redness or irritation at the intravenous (IV) site.  SEEK IMMEDIATE MEDICAL CARE IF:   Any of the following symptoms occur over the next 12 hours:   Shaking chills.   You have a temperature by mouth above 102 F (38.9 C), not controlled by medicine.   Chest, back, or muscle pain.   People around you feel you are not acting correctly or are confused.   Shortness of breath or difficulty breathing.   Dizziness and fainting.     You get a rash or develop hives.   You have a decrease in urine output.   Your urine turns a dark color or changes to pink, red, or brown.  Any of the following symptoms occur over the next 10 days:   You have a temperature by mouth above 102 F (38.9 C), not controlled by medicine.   Shortness of breath.   Weakness after normal activity.   The white part of the eye turns yellow (jaundice).   You have a decrease in the amount of urine or are urinating less often.   Your  urine turns a dark color or changes to pink, red, or brown.  Document Released: 08/06/2000 Document Revised: 07/29/2011 Document Reviewed: 03/25/2008  [employer] Patient Information 2012 [employer].

## 2012-02-19 LAB — TYPE AND SCREEN
Unit division: 0
Unit division: 0

## 2012-02-28 ENCOUNTER — Other Ambulatory Visit: Payer: Self-pay | Admitting: Medical Oncology

## 2012-02-28 ENCOUNTER — Telehealth: Payer: Self-pay | Admitting: *Deleted

## 2012-02-28 ENCOUNTER — Encounter: Payer: Self-pay | Admitting: Medical Oncology

## 2012-02-28 NOTE — Telephone Encounter (Signed)
Sent michelle email to set up patient feraheme for sometime this week

## 2012-02-28 NOTE — Progress Notes (Signed)
Spoke with patient informing her of appointment to receive feraheme 7/10 @ 0730.  Patient confirmed date and time.

## 2012-02-28 NOTE — Progress Notes (Signed)
Patient came to clinic after MD visit with Dr. Jennette Kettle.  Reported that hemoglobin was 6.7.  Patient received 2 units PRBC 6/28, c/o being "barely able to make it."  "I just wanted to let Dr. Welton Flakes know that I am going to have a hysterectomy on 7/18.  Dr. Jennette Kettle thinks this will stop all of my bleeding.  He doesn't want me to have another blood transfusion due to my upcoming surgery, but I just wanted to let Dr. Welton Flakes know."  Instructed patient to rest as much as possible, proceed to nearest emergency room if she experiences any unusual bleeding, shortness of breath, dizziness.  Patient expressed understanding, no further questions at this time.

## 2012-03-01 ENCOUNTER — Other Ambulatory Visit: Payer: Self-pay | Admitting: Oncology

## 2012-03-01 ENCOUNTER — Ambulatory Visit (HOSPITAL_BASED_OUTPATIENT_CLINIC_OR_DEPARTMENT_OTHER): Payer: BC Managed Care – PPO

## 2012-03-01 VITALS — BP 135/89 | HR 89 | Temp 98.1°F

## 2012-03-01 DIAGNOSIS — D500 Iron deficiency anemia secondary to blood loss (chronic): Secondary | ICD-10-CM

## 2012-03-01 DIAGNOSIS — N920 Excessive and frequent menstruation with regular cycle: Secondary | ICD-10-CM

## 2012-03-01 MED ORDER — SODIUM CHLORIDE 0.9 % IV SOLN
Freq: Once | INTRAVENOUS | Status: AC
  Administered 2012-03-01: 08:00:00 via INTRAVENOUS

## 2012-03-01 MED ORDER — SODIUM CHLORIDE 0.9 % IV SOLN
1020.0000 mg | Freq: Once | INTRAVENOUS | Status: AC
  Administered 2012-03-01: 1020 mg via INTRAVENOUS
  Filled 2012-03-01: qty 34

## 2012-03-02 ENCOUNTER — Encounter (HOSPITAL_BASED_OUTPATIENT_CLINIC_OR_DEPARTMENT_OTHER): Payer: Self-pay | Admitting: *Deleted

## 2012-03-06 ENCOUNTER — Encounter (HOSPITAL_BASED_OUTPATIENT_CLINIC_OR_DEPARTMENT_OTHER): Payer: Self-pay | Admitting: *Deleted

## 2012-03-06 NOTE — Progress Notes (Signed)
Pt instructed npo p mn 7/17 x norvasc and iron w/ sip of water.  To [facility] 7/18 @ 0615.  Pt to come 7/16 for labs. Needs cbc, t&s,u/a.  Needs ekg, urine hcg on arrival.  Recovery Care Guidelines reviewed.  Pt verbalized her understanding.

## 2012-03-07 LAB — CBC
Platelets: 612 10*3/uL — ABNORMAL HIGH (ref 150–400)
RDW: 20.8 % — ABNORMAL HIGH (ref 11.5–15.5)
WBC: 10.7 10*3/uL — ABNORMAL HIGH (ref 4.0–10.5)

## 2012-03-08 NOTE — Anesthesia Preprocedure Evaluation (Addendum)
Anesthesia Evaluation  Patient identified by MRN, date of birth, ID band Patient awake    Reviewed: Allergy & Precautions, H&P , NPO status , Patient's Chart, lab work & pertinent test results  Airway Mallampati: II TM Distance: >3 FB Neck ROM: full    Dental No notable dental hx. (+) Teeth Intact and Dental Advisory Given   Pulmonary neg pulmonary ROS,  breath sounds clear to auscultation  Pulmonary exam normal       Cardiovascular Exercise Tolerance: Good hypertension, Pt. on medications Rhythm:regular Rate:Normal     Neuro/Psych negative neurological ROS  negative psych ROS   GI/Hepatic negative GI ROS, Neg liver ROS,   Endo/Other  Well Controlled, Type obesity  Renal/GU negative Renal ROS  negative genitourinary   Musculoskeletal   Abdominal (+) + obese,   Peds  Hematology  (+) Blood dyscrasia, anemia , Iron def. Anemia Hgb. 9.3   Anesthesia Other Findings   Reproductive/Obstetrics negative OB ROS                          Anesthesia Physical Anesthesia Plan  ASA: III  Anesthesia Plan: General   Post-op Pain Management:    Induction: Intravenous  Airway Management Planned: Oral ETT  Additional Equipment:   Intra-op Plan:   Post-operative Plan: Extubation in OR  Informed Consent: I have reviewed the patients History and Physical, chart, labs and discussed the procedure including the risks, benefits and alternatives for the proposed anesthesia with the patient or authorized representative who has indicated his/her understanding and acceptance.   Dental Advisory Given  Plan Discussed with: CRNA and Surgeon  Anesthesia Plan Comments:        Anesthesia Quick Evaluation

## 2012-03-09 ENCOUNTER — Encounter (HOSPITAL_BASED_OUTPATIENT_CLINIC_OR_DEPARTMENT_OTHER): Payer: Self-pay | Admitting: *Deleted

## 2012-03-09 ENCOUNTER — Inpatient Hospital Stay (HOSPITAL_BASED_OUTPATIENT_CLINIC_OR_DEPARTMENT_OTHER)
Admission: RE | Admit: 2012-03-09 | Discharge: 2012-03-11 | DRG: 353 | Disposition: A | Discharge status: Home / Self Care | Payer: BC Managed Care – PPO | Source: Ambulatory Visit | Attending: Obstetrics and Gynecology | Admitting: Obstetrics and Gynecology

## 2012-03-09 ENCOUNTER — Other Ambulatory Visit: Payer: Self-pay | Admitting: Obstetrics and Gynecology

## 2012-03-09 ENCOUNTER — Ambulatory Visit (HOSPITAL_BASED_OUTPATIENT_CLINIC_OR_DEPARTMENT_OTHER): Payer: BC Managed Care – PPO | Admitting: Anesthesiology

## 2012-03-09 ENCOUNTER — Encounter (HOSPITAL_BASED_OUTPATIENT_CLINIC_OR_DEPARTMENT_OTHER): Payer: Self-pay | Admitting: Anesthesiology

## 2012-03-09 ENCOUNTER — Encounter (HOSPITAL_COMMUNITY)
Admission: RE | Disposition: A | Discharge status: Home / Self Care | Payer: Self-pay | Source: Ambulatory Visit | Attending: Obstetrics and Gynecology

## 2012-03-09 DIAGNOSIS — D500 Iron deficiency anemia secondary to blood loss (chronic): Secondary | ICD-10-CM | POA: Diagnosis present

## 2012-03-09 DIAGNOSIS — E119 Type 2 diabetes mellitus without complications: Secondary | ICD-10-CM | POA: Diagnosis present

## 2012-03-09 DIAGNOSIS — Z7982 Long term (current) use of aspirin: Secondary | ICD-10-CM

## 2012-03-09 DIAGNOSIS — I10 Essential (primary) hypertension: Secondary | ICD-10-CM | POA: Diagnosis present

## 2012-03-09 DIAGNOSIS — N949 Unspecified condition associated with female genital organs and menstrual cycle: Secondary | ICD-10-CM | POA: Diagnosis present

## 2012-03-09 DIAGNOSIS — Z6841 Body Mass Index (BMI) 40.0 and over, adult: Secondary | ICD-10-CM

## 2012-03-09 DIAGNOSIS — Z79899 Other long term (current) drug therapy: Secondary | ICD-10-CM

## 2012-03-09 DIAGNOSIS — Z90710 Acquired absence of both cervix and uterus: Secondary | ICD-10-CM

## 2012-03-09 DIAGNOSIS — N938 Other specified abnormal uterine and vaginal bleeding: Secondary | ICD-10-CM | POA: Diagnosis present

## 2012-03-09 DIAGNOSIS — D259 Leiomyoma of uterus, unspecified: Principal | ICD-10-CM | POA: Diagnosis present

## 2012-03-09 HISTORY — PX: ABDOMINAL HYSTERECTOMY: SHX81

## 2012-03-09 HISTORY — PX: LAPAROSCOPIC ASSISTED VAGINAL HYSTERECTOMY: SHX5398

## 2012-03-09 HISTORY — DX: Essential (primary) hypertension: I10

## 2012-03-09 HISTORY — DX: Personal history of other medical treatment: Z92.89

## 2012-03-09 LAB — GLUCOSE, CAPILLARY
Glucose-Capillary: 143 mg/dL — ABNORMAL HIGH (ref 70–99)
Glucose-Capillary: 272 mg/dL — ABNORMAL HIGH (ref 70–99)

## 2012-03-09 LAB — CBC
MCH: 27 pg (ref 26.0–34.0)
MCHC: 30.8 g/dL (ref 30.0–36.0)
RDW: 21.1 % — ABNORMAL HIGH (ref 11.5–15.5)

## 2012-03-09 LAB — POCT PREGNANCY, URINE: Preg Test, Ur: NEGATIVE

## 2012-03-09 SURGERY — HYSTERECTOMY, VAGINAL, LAPAROSCOPY-ASSISTED
Anesthesia: General | Site: Abdomen | Wound class: Clean

## 2012-03-09 MED ORDER — LACTATED RINGERS IR SOLN
Status: DC | PRN
  Administered 2012-03-09: 3000 mL

## 2012-03-09 MED ORDER — SODIUM CHLORIDE 0.9 % IJ SOLN
INTRAMUSCULAR | Status: DC | PRN
  Administered 2012-03-09: 10 mL via INTRAVENOUS

## 2012-03-09 MED ORDER — BUPIVACAINE HCL (PF) 0.5 % IJ SOLN
INTRAMUSCULAR | Status: DC | PRN
  Administered 2012-03-09: 7 mL

## 2012-03-09 MED ORDER — LACTATED RINGERS IV SOLN: INTRAVENOUS | Status: DC

## 2012-03-09 MED ORDER — LACTATED RINGERS IV SOLN
INTRAVENOUS | Status: DC
  Administered 2012-03-09 – 2012-03-10 (×2): via INTRAVENOUS

## 2012-03-09 MED ORDER — CEFAZOLIN SODIUM-DEXTROSE 2-3 GM-% IV SOLR: 2.0000 g | INTRAVENOUS | Status: DC

## 2012-03-09 MED ORDER — AMLODIPINE BESYLATE 10 MG PO TABS
10.0000 mg | ORAL_TABLET | Freq: Every day | ORAL | Status: DC
  Administered 2012-03-10 – 2012-03-11 (×2): 10 mg via ORAL
  Filled 2012-03-09 (×2): qty 1

## 2012-03-09 MED ORDER — BENAZEPRIL HCL 20 MG PO TABS
20.0000 mg | ORAL_TABLET | Freq: Every day | ORAL | Status: DC
  Administered 2012-03-09 – 2012-03-11 (×3): 20 mg via ORAL
  Filled 2012-03-09 (×3): qty 1

## 2012-03-09 MED ORDER — LACTATED RINGERS IV SOLN
INTRAVENOUS | Status: DC
  Administered 2012-03-09: 07:00:00 via INTRAVENOUS

## 2012-03-09 MED ORDER — DIPHENHYDRAMINE HCL 12.5 MG/5ML PO ELIX: 12.5000 mg | ORAL_SOLUTION | Freq: Four times a day (QID) | ORAL | Status: DC | PRN

## 2012-03-09 MED ORDER — SODIUM CHLORIDE 0.9 % IJ SOLN: 9.0000 mL | INTRAMUSCULAR | Status: DC | PRN

## 2012-03-09 MED ORDER — ONDANSETRON HCL 4 MG/2ML IJ SOLN: 4.0000 mg | Freq: Four times a day (QID) | INTRAMUSCULAR | Status: DC | PRN

## 2012-03-09 MED ORDER — FENTANYL CITRATE 0.05 MG/ML IJ SOLN: 25.0000 ug | INTRAMUSCULAR | Status: DC | PRN

## 2012-03-09 MED ORDER — HYDROMORPHONE 0.3 MG/ML IV SOLN
INTRAVENOUS | Status: DC
  Administered 2012-03-09: 0.2 mg via INTRAVENOUS
  Administered 2012-03-09: 0.399 mg via INTRAVENOUS
  Administered 2012-03-09: 14:00:00 via INTRAVENOUS
  Administered 2012-03-10: 0.399 mg via INTRAVENOUS
  Administered 2012-03-10: 0.2 mg via INTRAVENOUS
  Filled 2012-03-09: qty 25

## 2012-03-09 MED ORDER — BENAZEPRIL-HYDROCHLOROTHIAZIDE 20-25 MG PO TABS: 1.0000 | ORAL_TABLET | Freq: Every day | ORAL | Status: DC

## 2012-03-09 MED ORDER — DIPHENHYDRAMINE HCL 50 MG/ML IJ SOLN: 12.5000 mg | Freq: Four times a day (QID) | INTRAMUSCULAR | Status: DC | PRN

## 2012-03-09 MED ORDER — HYDROCHLOROTHIAZIDE 25 MG PO TABS
25.0000 mg | ORAL_TABLET | Freq: Every day | ORAL | Status: DC
  Administered 2012-03-09 – 2012-03-11 (×3): 25 mg via ORAL
  Filled 2012-03-09 (×3): qty 1

## 2012-03-09 MED ORDER — NALOXONE HCL 0.4 MG/ML IJ SOLN: 0.4000 mg | INTRAMUSCULAR | Status: DC | PRN

## 2012-03-09 SURGICAL SUPPLY — 103 items
ADH SKN CLS APL DERMABOND .7 (GAUZE/BANDAGES/DRESSINGS) ×1
APL SKNCLS STERI-STRIP NONHPOA (GAUZE/BANDAGES/DRESSINGS) ×1
APPLICATOR COTTON TIP 6IN STRL (MISCELLANEOUS) ×2 IMPLANT
BAG DRN ANRFLXCHMBR STRAP LEK (BAG) ×1
BAG SPEC RTRVL LRG 6X4 10 (ENDOMECHANICALS)
BAG URINE DRAINAGE (UROLOGICAL SUPPLIES) ×2 IMPLANT
BAG URINE LEG 19OZ MD ST LTX (BAG) ×1 IMPLANT
BAG URINE LEG 500ML (DRAIN) IMPLANT
BENZOIN TINCTURE PRP APPL 2/3 (GAUZE/BANDAGES/DRESSINGS) ×1 IMPLANT
BLADE SURG 11 STRL SS (BLADE) ×3 IMPLANT
BLADE SURG 15 STRL LF DISP TIS (BLADE) IMPLANT
BLADE SURG 15 STRL SS (BLADE)
BLADE SURG ROTATE 9660 (MISCELLANEOUS) IMPLANT
CANISTER SUCTION 2500CC (MISCELLANEOUS) ×4 IMPLANT
CATH FOLEY 2WAY SLVR  5CC 14FR (CATHETERS) ×1
CATH FOLEY 2WAY SLVR  5CC 16FR (CATHETERS)
CATH FOLEY 2WAY SLVR 5CC 14FR (CATHETERS) ×1 IMPLANT
CATH FOLEY 2WAY SLVR 5CC 16FR (CATHETERS) IMPLANT
CATH ROBINSON RED A/P 16FR (CATHETERS) ×1 IMPLANT
CLOTH BEACON ORANGE TIMEOUT ST (SAFETY) ×2 IMPLANT
CONT PATH 16OZ SNAP LID 3702 (MISCELLANEOUS) ×2 IMPLANT
COVER MAYO STAND STRL (DRAPES) ×3 IMPLANT
COVER TABLE BACK 60X90 (DRAPES) ×3 IMPLANT
DECANTER SPIKE VIAL GLASS SM (MISCELLANEOUS) IMPLANT
DERMABOND ADVANCED (GAUZE/BANDAGES/DRESSINGS) ×1
DERMABOND ADVANCED .7 DNX12 (GAUZE/BANDAGES/DRESSINGS) ×1 IMPLANT
DRAPE CAMERA CLOSED 9X96 (DRAPES) ×2 IMPLANT
DRAPE LAPAROTOMY TRNSV 102X78 (DRAPE) ×1 IMPLANT
DRAPE LG THREE QUARTER DISP (DRAPES) ×2 IMPLANT
DRAPE UNDERBUTTOCKS STRL (DRAPE) ×2 IMPLANT
DRESSING TELFA 8X3 (GAUZE/BANDAGES/DRESSINGS) IMPLANT
ELECT BLADE 6.5 .24CM SHAFT (ELECTRODE) ×1 IMPLANT
ELECT LIGASURE LONG (ELECTRODE) ×1 IMPLANT
ELECT LIGASURE SHORT 9 REUSE (ELECTRODE) IMPLANT
ELECT REM PT RETURN 9FT ADLT (ELECTROSURGICAL) ×2
ELECTRODE REM PT RTRN 9FT ADLT (ELECTROSURGICAL) IMPLANT
GLOVE BIO SURGEON STRL SZ8 (GLOVE) ×10 IMPLANT
GLOVE BIOGEL PI IND STRL 6.5 (GLOVE) ×1 IMPLANT
GLOVE BIOGEL PI INDICATOR 6.5 (GLOVE)
GLOVE ECLIPSE 6.5 STRL STRAW (GLOVE) ×2 IMPLANT
GOWN PREVENTION PLUS LG XLONG (DISPOSABLE) ×8 IMPLANT
HOLDER FOLEY CATH W/STRAP (MISCELLANEOUS) ×2 IMPLANT
MONOCRYL ×1 IMPLANT
NDL HYPO 25X1 1.5 SAFETY (NEEDLE) ×1 IMPLANT
NDL INSUFFLATION 14GA 120MM (NEEDLE) ×1 IMPLANT
NDL INSUFFLATION 14GA 150MM (NEEDLE) IMPLANT
NDL SPNL 22GX3.5 QUINCKE BK (NEEDLE) IMPLANT
NEEDLE HYPO 25X1 1.5 SAFETY (NEEDLE) ×2 IMPLANT
NEEDLE INSUFFLATION 14GA 120MM (NEEDLE) IMPLANT
NEEDLE INSUFFLATION 14GA 150MM (NEEDLE) ×2 IMPLANT
NEEDLE SPNL 22GX3.5 QUINCKE BK (NEEDLE) IMPLANT
NS IRRIG 1000ML POUR BTL (IV SOLUTION) ×1 IMPLANT
NS IRRIG 500ML POUR BTL (IV SOLUTION) ×5 IMPLANT
PACK ABDOMINAL GYN (CUSTOM PROCEDURE TRAY) ×1 IMPLANT
PACK BASIN DAY SURGERY FS (CUSTOM PROCEDURE TRAY) ×2 IMPLANT
PACK LAVH (CUSTOM PROCEDURE TRAY) ×1 IMPLANT
PAD OB MATERNITY 4.3X12.25 (PERSONAL CARE ITEMS) ×2 IMPLANT
PAD PREP 24X48 CUFFED NSTRL (MISCELLANEOUS) ×2 IMPLANT
PDS ×2 IMPLANT
POUCH SPECIMEN RETRIEVAL 10MM (ENDOMECHANICALS) IMPLANT
PROTECTOR NERVE ULNAR (MISCELLANEOUS) ×1 IMPLANT
SCISSORS LAP 5X35 DISP (ENDOMECHANICALS) IMPLANT
SCISSORS LAP 5X45 EPIX DISP (ENDOMECHANICALS) IMPLANT
SEALER TISSUE G2 CVD JAW 45CM (ENDOMECHANICALS) ×2 IMPLANT
SET IRRIG TUBING LAPAROSCOPIC (IRRIGATION / IRRIGATOR) ×1 IMPLANT
SHEET LAVH (DRAPES) ×2 IMPLANT
SOLUTION ANTI FOG 6CC (MISCELLANEOUS) ×2 IMPLANT
SOLUTION ELECTROLUBE (MISCELLANEOUS) IMPLANT
SPONGE GAUZE 4X4 12PLY (GAUZE/BANDAGES/DRESSINGS) ×2 IMPLANT
SPONGE LAP 18X18 X RAY DECT (DISPOSABLE) ×6 IMPLANT
SPONGE LAP 4X18 X RAY DECT (DISPOSABLE) ×3 IMPLANT
STAPLER VISISTAT 35W (STAPLE) ×2 IMPLANT
STRIP CLOSURE SKIN 1/4X3 (GAUZE/BANDAGES/DRESSINGS) ×1 IMPLANT
STRIP CLOSURE SKIN 1/4X4 (GAUZE/BANDAGES/DRESSINGS) ×2 IMPLANT
SUT MNCRL 0 MO-4 VIOLET 18 CR (SUTURE) ×3 IMPLANT
SUT MNCRL 0 VIOLET 6X18 (SUTURE) ×1 IMPLANT
SUT MNCRL AB 0 CT1 27 (SUTURE) ×2 IMPLANT
SUT MONOCRYL 0 6X18 (SUTURE) ×2
SUT MONOCRYL 0 MO 4 18  CR/8 (SUTURE) ×3
SUT PDS AB 0 CTX 60 (SUTURE) ×4 IMPLANT
SUT PLAIN 2 0 XLH (SUTURE) IMPLANT
SUT VIC AB 4-0 PS2 27 (SUTURE) ×2 IMPLANT
SUT VICRYL 0 UR6 27IN ABS (SUTURE) ×2 IMPLANT
SYR 30ML LL (SYRINGE) ×2 IMPLANT
SYR 3ML 23GX1 SAFETY (SYRINGE) IMPLANT
SYR 5ML LL (SYRINGE) ×1 IMPLANT
SYR BULB IRRIGATION 50ML (SYRINGE) ×2 IMPLANT
SYR CONTROL 10ML LL (SYRINGE) ×2 IMPLANT
SYRINGE 10CC LL (SYRINGE) ×4 IMPLANT
TAPE HYPAFIX 6X30 (GAUZE/BANDAGES/DRESSINGS) ×1 IMPLANT
TOWEL OR 17X24 6PK STRL BLUE (TOWEL DISPOSABLE) ×5 IMPLANT
TRAY DSU PREP LF (CUSTOM PROCEDURE TRAY) ×2 IMPLANT
TRAY FOLEY CATH 14FR (SET/KITS/TRAYS/PACK) ×1 IMPLANT
TROCAR XCEL NON-BLD 11X100MML (ENDOMECHANICALS) ×2 IMPLANT
TROCAR XCEL NON-BLD 5MMX100MML (ENDOMECHANICALS) ×1 IMPLANT
TUBE CONNECTING 12X1/4 (SUCTIONS) ×4 IMPLANT
TUBING INSUFFLATION W/FILTER (TUBING) ×2 IMPLANT
VACUUM HOSE/TUBING 7/8INX6FT (MISCELLANEOUS) IMPLANT
WARMER DRAPE ×2 IMPLANT
WARMER LAPAROSCOPE (MISCELLANEOUS) ×1 IMPLANT
WATER STERILE IRR 1000ML POUR (IV SOLUTION) ×1 IMPLANT
WATER STERILE IRR 500ML POUR (IV SOLUTION) ×2 IMPLANT
YANKAUER SUCT BULB TIP NO VENT (SUCTIONS) ×2 IMPLANT

## 2012-03-09 NOTE — Brief Op Note (Signed)
03/09/2012  9:48 AM  PATIENT:  Courtney Cox  46 y.o. female  PRE-OPERATIVE DIAGNOSIS:  FIBROIDS, MENORRHAGIA AND ANEMIA  POST-OPERATIVE DIAGNOSIS:  FIBROIDS, MENORRAGIA,ANEMIA  PROCEDURE:  Procedure(s) (LRB): LAPAROSCOPy, TOTAL HYSTERECTOMY ABDOMINAL (N/A), left salpingo-oophorectomy  SURGEON:  Surgeon(s) and Role:    * Leslie Andrea, MD - Primary      PHYSICIAN ASSISTANT: Varney Baas MD  ASSISTANTS: none   ANESTHESIA:   general  EBL:     BLOOD ADMINISTERED:none  DRAINS: Urinary Catheter (Foley)   LOCAL MEDICATIONS USED:  NONE  SPECIMEN:  Source of Specimen:  uterus, left tube/ovary  DISPOSITION OF SPECIMEN:  PATHOLOGY  COUNTS:  YES  TOURNIQUET:  * No tourniquets in log *  DICTATION: .Other Dictation: Dictation Number  [id number]  PLAN OF CARE: Admit to inpatient   PATIENT DISPOSITION:  PACU - hemodynamically stable.   Delay start of Pharmacological VTE agent (>24hrs) due to surgical blood loss or risk of bleeding: not applicable

## 2012-03-09 NOTE — Progress Notes (Signed)
Reviewed with patient LAVH, possible TAH, removal of one or both ovaries if abnormal. No changes to H&P per patient history.

## 2012-03-09 NOTE — H&P (Signed)
Courtney Cox, Courtney Cox               ACCOUNT NO.:  [id number]  MEDICAL RECORD NO.:  [id number]  LOCATION:                               FACILITY:  [facility]  PHYSICIAN:  Freddy Finner, M.D.   DATE OF BIRTH:  Jun 27, 1966  DATE OF ADMISSION: DATE OF DISCHARGE:                             HISTORY & PHYSICAL   HISTORY OF PRESENT ILLNESS:  The patient is a 46 year old black single female, gravida 3, para 3, who was first seen in my office in April of this year, at which time she presented complaining of profound menometrorrhagia.  She is known to have uterine leiomyomata.  She had had an endometrial ablation in 2009 for menorrhagia which failed.  She also had a Mirena IUD which failed.  At her first visit here in this office, her hemoglobin was 9.2, but she has been receiving blood transfusions to keep her hemoglobin in that range.  She has been followed by [facility] in [place].  At the time of her initial examination on April 8 of this year, pelvic ultrasound was obtained which showed numerous leiomyomata with the largest measuring 7.3 cm and at least 4 others that were 3 cm or more.  The ultrasound was remarkable for the endometrial stripe consistent with ablation.  The patient was initially scheduled for surgery in June, but for financial reasons, the patient had to reschedule her surgery.  She is scheduled and admitted now for laparoscopically assisted vaginal hysterectomy, possible open hysterectomy, possible bilateral salpingo-oophorectomy. Because of a conflict in schedule, Dr. Harold Hedge will actually be the primary surgeon with my assistance.  The patient has reviewed ACOG materials regarding hysterectomy including potential risk of injury to other organs, open procedure, risk of infection, risk of deep vein thrombosis.  Prophylactic measures directed at reducing these have been also discussed.  She has also consulted with Dr. Henderson Cloud today who reviewed the similar  things.  She is admitted now and prepared now to proceed with surgery.  REVIEW OF SYSTEMS:  Her current review of systems is otherwise negative. She has no cardiopulmonary, GI or other GU complaints.  Her only known significant medical illness is hypertension and anemia secondary to her bleeding.  She is on an antihypertensive which has not been recorded in the office record, can be determined on the intake. She takes no other medications on a regular basis except for iron supplements.  In the very recent past, she has also had an iron transfusion.  She has no known allergies to medications.  She does not use cigarettes. She does not use alcohol.  FAMILY HISTORY:  Noncontributory.  Complete GYN exam was performed in April, at which time the breast exam was considered to be completely normal.  No masses.  No skin changes. No nipple discharge.  Her abdomen was clear and remarkable only for the large mass above the symphysis and for her moderately severe obesity with a BMI of greater than 35.  PHYSICAL EXAMINATION:  VITAL SIGNS:  Her blood pressure in the office on preoperative examination was 148/98. NECK:  At that time, the thyroid gland could not be palpably enlarged. CHEST:  Clear to auscultation throughout. HEART:  Normal sinus rhythm without murmurs, rubs, or gallops. PELVIC:  The external genitalia, vagina, and cervix were normal. Bimanual is somewhat compromised by body habitus, but the uterus is enlarged.  On clinical exam, there are no palpable adnexal masses. RECTUM:  Palpably normal and the rectovaginal exam confirms these findings. EXTREMITIES:  Without cyanosis, clubbing, or edema.  ASSESSMENT:  Numerous large uterine leiomyomata with an extremely long history of menometrorrhagia, unresponsive to more conservative measures.  PLAN:  Laparoscopically assisted vaginal hysterectomy, possible open hysterectomy, possible bilateral salpingo-oophorectomy.  As noted  above due to scheduling problems on my part as I need to be away for an extended period after surgery, we have elected, with the patient's consent, to let Dr. Henderson Cloud manage this admission and surgery with my assistance at the surgery.     Freddy Finner, M.D.     WRN/MEDQ  D:  03/08/2012  T:  03/09/2012  Job:  [id number]

## 2012-03-09 NOTE — Anesthesia Postprocedure Evaluation (Signed)
  Anesthesia Post-op Note  Patient: Courtney Cox  Procedure(s) Performed: Procedure(s) (LRB): LAPAROSCOPIC ASSISTED VAGINAL HYSTERECTOMY (N/A) HYSTERECTOMY ABDOMINAL (N/A)  Patient Location: PACU  Anesthesia Type: General  Level of Consciousness: awake and alert   Airway and Oxygen Therapy: Patient Spontanous Breathing  Post-op Pain: mild  Post-op Assessment: Post-op Vital signs reviewed, Patient's Cardiovascular Status Stable, Respiratory Function Stable, Patent Airway and No signs of Nausea or vomiting  Post-op Vital Signs: stable  Complications: No apparent anesthesia complications

## 2012-03-09 NOTE — H&P (Signed)
Courtney Cox is an 46 y.o. female G3P3 S/P EMA, S/P Mirena with uterine fibroids and heavy menses.  She has continued very heavy menses and secondary anemia requiring transfusion of PRBCs in last several weeks.  S/P iron transfusion last 1-2 weeks.  U/S in office C/W multiple fibroids from approximately 2.5 to 7.5 cm in size.  Pertinent Gynecological History: Menses: flow is excessive with use of many pads or tampons on heaviest days Bleeding: dysfunctional uterine bleeding Contraception: none DES exposure: unknown Blood transfusions: as above Sexually transmitted diseases: no past history Previous GYN Procedures: EMA  Last mammogram: normal Date: 2013 Last pap: normal Date: 2013 OB History: G3, P3   Menstrual History: Menarche age: unknown Patient's last menstrual period was 02/07/2012.    Past Medical History  Diagnosis Date  . Diabetes mellitus     on watch,not on meds  . Hypertension   . History of blood transfusion 02/18/12    received 2 uprbc for hgb 6.5  . Anemia 2008    iron deficiency  . 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 Drue Second @ [facility]    Past Surgical History  Procedure Date  . Hysteroscopy w/ endometrial hydrothermal ablation 12-31-2008    MENOMETRORRHAGIA    Family History  Problem Relation Age of Onset  . Diabetes Father     Social History:  reports that she has never smoked. She has never used smokeless tobacco. She reports that she drinks alcohol. She reports that she does not use illicit drugs.  Allergies: No Known Allergies  Prescriptions prior to admission  Medication Sig Dispense Refill  . amLODipine (NORVASC) 10 MG tablet Take 10 mg by mouth daily.      . ferrous fumarate-iron polysaccharide complex (TANDEM) 162-115.2 MG CAPS Take 1 capsule by mouth daily with breakfast.      . aspirin 81 MG tablet Take 81 mg by mouth daily.      . benazepril-hydrochlorthiazide (LOTENSIN HCT) 20-25 MG per tablet  Take 1 tablet by mouth daily.        ROS  Blood pressure 141/88, pulse 99, temperature 98.6 F (37 C), temperature source Oral, resp. rate 16, height 5\' 5"  (1.651 m), weight 129.729 kg (286 lb), last menstrual period 02/07/2012, SpO2 99.00%. Physical Exam  Cardiovascular: Normal rate and regular rhythm.   Respiratory: Effort normal and breath sounds normal.  GI: There is no tenderness.    Results for orders placed during the hospital encounter of 03/09/12 (from the past 24 hour(s))  POCT PREGNANCY, URINE     Status: Normal   Collection Time   03/09/12  6:30 AM      Component Value Range   Preg Test, Ur NEGATIVE  NEGATIVE    No results found.  Assessment/Plan: 46 yo with fibroids and menometrorrhagia and secondary anemia. D/W patient options. Admitted for LAVH, will preserve ovaries if they appear normal, remove one or both if abnormal.  D/W possible conversion to    [facility] II,Courtney Cox 03/09/2012, 6:56 AM

## 2012-03-09 NOTE — Progress Notes (Signed)
FSBS 272 Dr. Acey Lav notified, no orders received.

## 2012-03-09 NOTE — Progress Notes (Signed)
Tolerating liquids, no flatus, good pain relief  Blood pressure 158/78, pulse 113, temperature 98.4 F (36.9 C), temperature source Oral, resp. rate 20, height 5\' 5"  (1.651 m), weight 129.729 kg (286 lb), last menstrual period 02/07/2012, SpO2 96.00%.  Results for orders placed during the hospital encounter of 03/09/12 (from the past 24 hour(s))  POCT PREGNANCY, URINE     Status: Normal   Collection Time   03/09/12  6:30 AM      Component Value Range   Preg Test, Ur NEGATIVE  NEGATIVE  GLUCOSE, CAPILLARY     Status: Abnormal   Collection Time   03/09/12  7:17 AM      Component Value Range   Glucose-Capillary 143 (*) 70 - 99 mg/dL  GLUCOSE, CAPILLARY     Status: Abnormal   Collection Time   03/09/12 10:58 AM      Component Value Range   Glucose-Capillary 272 (*) 70 - 99 mg/dL   Comment 1 Notify RN    CBC     Status: Abnormal   Collection Time   03/09/12  2:03 PM      Component Value Range   WBC 21.5 (*) 4.0 - 10.5 K/uL   RBC 3.19 (*) 3.87 - 5.11 MIL/uL   Hemoglobin 8.6 (*) 12.0 - 15.0 g/dL   HCT 24.4 (*) 01.0 - 27.2 %   MCV 87.5  78.0 - 100.0 fL   MCH 27.0  26.0 - 34.0 pg   MCHC 30.8  30.0 - 36.0 g/dL   RDW 53.6 (*) 64.4 - 03.4 %   Platelets 483 (*) 150 - 400 K/uL   Lungs CTA Cor RRR Abd soft, BS +  Ext PAS on  Stable Check CBC in am D/W patient possible transfusion if becomes symptomatic with anemia or if Hgb is too low tomorrow, reviewed risks of transfusion reaction, HIV/Hep.

## 2012-03-10 LAB — CBC
HCT: 25.6 % — ABNORMAL LOW (ref 36.0–46.0)
Hemoglobin: 8.3 g/dL — ABNORMAL LOW (ref 12.0–15.0)
MCH: 28.2 pg (ref 26.0–34.0)
MCHC: 32.4 g/dL (ref 30.0–36.0)

## 2012-03-10 MED ORDER — OXYCODONE-ACETAMINOPHEN 5-325 MG PO TABS
2.0000 | ORAL_TABLET | ORAL | Status: DC | PRN
  Administered 2012-03-10 – 2012-03-11 (×2): 2 via ORAL
  Filled 2012-03-10 (×2): qty 2

## 2012-03-10 MED ORDER — IBUPROFEN 600 MG PO TABS
600.0000 mg | ORAL_TABLET | Freq: Four times a day (QID) | ORAL | Status: DC | PRN
  Filled 2012-03-10: qty 1

## 2012-03-10 NOTE — Op Note (Signed)
Courtney Cox, Courtney Cox               ACCOUNT NO.:  [id number]  MEDICAL RECORD NO.:  [id number]  LOCATION:  1534                         FACILITY:  [facility]  PHYSICIAN:  Guy Sandifer. Henderson Cloud, M.D. DATE OF BIRTH:  October 26, 1965  DATE OF PROCEDURE:  03/09/2012 DATE OF DISCHARGE:                              OPERATIVE REPORT   PREOPERATIVE DIAGNOSIS:  Uterine leiomyomata.  POSTOPERATIVE DIAGNOSIS:  Uterine leiomyomata.  PROCEDURES: 1. Total abdominal hysterectomy with left salpingo-oophorectomy. 2. Laparoscopy.  SURGEON:  Guy Sandifer. Henderson Cloud, MD.  Threasa HeadsFreddy Finner, M.D.  ANESTHESIA:  General with endotracheal intubation.  ESTIMATED BLOOD LOSS:  600 mL.  SPECIMENS:  Uterus, left tube, and ovary all to Pathology.  INDICATIONS AND CONSENTS:  This patient is a 46 year old patient with known large uterine leiomyomata and secondary heavy menses and anemia. This has required transfusion of packed red blood cells as well as transfusion of iron.  She has had endometrial ablation as well as a Mirena IUD, all of which have been unsuccessful in controlling her bleeding.  After discussion of options, she was taken to the operating room for possible laparoscopically assisted vaginal hysterectomy, possible total abdominal hysterectomy.  Tubes and ovaries will be removed if abnormal.  Potential risks and complications were discussed preoperatively including but not limited to, infection, organ damage, bleeding requiring transfusion of blood products with HIV and hepatitis acquisition, DVT, PE, pneumonia, fistula formation, pelvic pain, painful intercourse.  All questions were answered and consent was signed on the chart.  FINDINGS:  At laparoscopy, the uterine fundus was essentially at the umbilicus.  The uterus could not be mobilized to expose the adnexa.  At the time of laparotomy, there was a large at least 10 cm leiomyoma noted as well as other smaller ones.  The right tube and ovary was  normal. Left ovary appeared normal.  DESCRIPTION OF PROCEDURE:  The patient was taken to the operating room where she was identified, placed in dorsal supine position and general anesthesia was induced via endotracheal intubation.  She was placed in dorsal lithotomy position.  Time-out was undertaken.  She was prepped abdominally and vaginally.  Bladder straight catheterized.  Hulka tenaculum was placed in the uterus as a manipulator and she was draped in a sterile fashion.  The infraumbilical and suprapubic areas were injected with approximately 7 mL of 0.5% plain Marcaine.  A small infraumbilical incision was made.  Disposable Veress needle was placed with a good syringe and drop test.  2 L of gas were insufflated under low pressure with good tympany in the right upper quadrant.  Veress needle was removed and a 10/11 Xcel bladeless disposable trocar sleeve was placed using direct visualization with diagnostic laparoscope.  The above findings were noted.  Hysteroscope was withdrawn.  The patient was repositioned in the dorsal supine position.  Foley catheter has been placed and she was re-prepped and draped in a sterile fashion.  The skin was entered through a vertical midline incision below the umbilicus. Dissection was carried out to the peritoneum which was entered and extended superiorly and inferiorly.  Balfour retractor was used.  The fundus was then raised up into the incision and the proximal  ligaments were taken down bilaterally with the handheld LigaSure bipolar cautery instrument.  This was carried down to the level of the uterine vessels bilaterally.  The vesicouterine peritoneum was taken down cephalad laterally as well.  Using a wide malleable with a backstop under good visualization, the fundus was resected with a knife at the upper level of the cervix.  Progressive bites were then taken of the cardinal ligaments, and the vagina was then entered bilaterally and the  cervix was cut free.  The cervix was closed with interrupted 0 Monocryl suture. The angle sutures were tied in the midline.  Inspection at this point reveals the blood supply to the left ovary to be very tenuous. Therefore, this was taken down with the LigaSure as well.  The right tube and ovary appeared to have a good vascular supply.  Inspection reveals good hemostasis.  Packs and retractors were removed.  Count was correct.  The anterior fascia was closed in a running fashion with a 0 looped PDS suture, taking wide bites of the fascia.  The adipose layer was closed with interrupted plain suture and the skin was closed with clips.  All sponge, instrument, and needle counts were correct and the patient was transferred to the recovery room in stable condition.     Guy Sandifer Henderson Cloud, M.D.     JET/MEDQ  D:  03/09/2012  T:  03/10/2012  Job:  [id number]

## 2012-03-10 NOTE — Progress Notes (Signed)
POD #1 Tolerating regular diet, passed flatus this am, up on feet without dizziness  Blood pressure 138/82, pulse 91, temperature 98.4 F (36.9 C), temperature source Oral, resp. rate 18, height 5\' 5"  (1.651 m), weight 129.729 kg (286 lb), last menstrual period 02/07/2012, SpO2 99.00%.  Results for orders placed during the hospital encounter of 03/09/12 (from the past 24 hour(s))  CBC     Status: Abnormal   Collection Time   03/09/12  2:03 PM      Component Value Range   WBC 21.5 (*) 4.0 - 10.5 K/uL   RBC 3.19 (*) 3.87 - 5.11 MIL/uL   Hemoglobin 8.6 (*) 12.0 - 15.0 g/dL   HCT 14.7 (*) 82.9 - 56.2 %   MCV 87.5  78.0 - 100.0 fL   MCH 27.0  26.0 - 34.0 pg   MCHC 30.8  30.0 - 36.0 g/dL   RDW 13.0 (*) 86.5 - 78.4 %   Platelets 483 (*) 150 - 400 K/uL  CBC     Status: Abnormal   Collection Time   03/10/12  4:03 AM      Component Value Range   WBC 16.9 (*) 4.0 - 10.5 K/uL   RBC 2.94 (*) 3.87 - 5.11 MIL/uL   Hemoglobin 8.3 (*) 12.0 - 15.0 g/dL   HCT 69.6 (*) 29.5 - 28.4 %   MCV 87.1  78.0 - 100.0 fL   MCH 28.2  26.0 - 34.0 pg   MCHC 32.4  30.0 - 36.0 g/dL   RDW 13.2 (*) 44.0 - 10.2 %   Platelets 430 (*) 150 - 400 K/uL   Lungs CTA Cor RRR Abd soft, BS + Up in chair  A: satisfactory  P: probable discharge tomorrow     D/C IV

## 2012-03-10 NOTE — Care Management Note (Signed)
    Page 1 of 1   03/10/2012     11:02:25 AM   CARE MANAGEMENT NOTE 03/10/2012  Patient:  Courtney Cox, Courtney Cox   Account Number:  [id number]  Date Initiated:  03/10/2012  Documentation initiated by:  Lorenda Ishihara  Subjective/Objective Assessment:   46 yo female admitted s/p total abd hysterectomy. PTA lived at home alone.     Action/Plan:   Follow for d/c needs   Anticipated DC Date:  03/13/2012   Anticipated DC Plan:  HOME/SELF CARE      DC Planning Services  CM consult      Choice offered to / List presented to:             Status of service:  In process, will continue to follow Medicare Important Message given?   (If response is "NO", the following Medicare IM given date fields will be blank) Date Medicare IM given:   Date Additional Medicare IM given:    Discharge Disposition:    Per UR Regulation:  Reviewed for med. necessity/level of care/duration of stay  If discussed at Long Length of Stay Meetings, dates discussed:    Comments:

## 2012-03-11 LAB — TYPE AND SCREEN
ABO/RH(D): O POS
Antibody Screen: NEGATIVE
Unit division: 0

## 2012-03-11 MED ORDER — IBUPROFEN 600 MG PO TABS: 600.0000 mg | ORAL_TABLET | Freq: Four times a day (QID) | ORAL | Status: AC | PRN

## 2012-03-11 MED ORDER — OXYCODONE-ACETAMINOPHEN 5-325 MG PO TABS: 2.0000 | ORAL_TABLET | ORAL | Status: AC | PRN

## 2012-03-11 NOTE — Progress Notes (Signed)
2 Days Post-Op Procedure(s) (LRB): LAPAROSCOPIC ASSISTED VAGINAL HYSTERECTOMY (N/A) HYSTERECTOMY ABDOMINAL (N/A)  Subjective: Patient reports tolerating PO, + flatus and no problems voiding.    Objective: I have reviewed patient's vital signs, intake and output and labs.  General: alert, cooperative, appears stated age and no distress GI: soft, non-tender; bowel sounds normal; no masses,  no organomegaly and incision: clean, dry and intact  Assessment: s/p Procedure(s) (LRB): LAPAROSCOPIC ASSISTED VAGINAL HYSTERECTOMY (N/A) HYSTERECTOMY ABDOMINAL (N/A): stable, progressing well and tolerating diet  Plan: Discharge home Ms. Reinecke is doing remarkably well with minimal pain.  Will dc home with Rxs for motrin and percocet but she will likely not take them She will cont feso4 and have her staples removed in 1 week  LOS: 2 days    Anglea Gordner C 03/11/2012, 10:39 AM

## 2012-03-11 NOTE — Progress Notes (Signed)
Pt discharged to home with daughter provided discharge instructions and prescriptions along with handouts. Pt verbalized understanding of discharge information. Pt stable. Pt transported by tech kristel  IV removed and documented. Annitta Needs, RN

## 2012-03-15 ENCOUNTER — Encounter (HOSPITAL_BASED_OUTPATIENT_CLINIC_OR_DEPARTMENT_OTHER): Payer: Self-pay | Admitting: Obstetrics and Gynecology

## 2012-03-21 NOTE — Discharge Summary (Signed)
Physician Discharge Summary  Patient ID: Courtney Cox MRN: [id number] DOB/AGE: [birth year] 46 y.o.  Admit date: 03/09/2012 Discharge date: 03/21/2012  Admission Diagnoses:Uterine Fibroids   Discharge Diagnoses: Uterine Fibroids Active Problems:  * No active hospital problems. *    Discharged Condition: good  Hospital Course: Admitted for surgery. Underwent laparoscopy and TAH/LSO. Postoperatively had good resumption of bowel function, ambulation, urine output and pain control.  Consults: None  Significant Diagnostic Studies: labs: Hgb 8.3  Treatments: IV hydration and surgery: laparoscopy, TAH/LSO  Discharge Exam: Blood pressure 152/78, pulse 112, temperature 98.4 F (36.9 C), temperature source Oral, resp. rate 18, height 5\' 5"  (1.651 m), weight 129.729 kg (286 lb), last menstrual period 02/07/2012, SpO2 100.00%. General appearance: alert, cooperative and no distress GI: soft, non-tender; bowel sounds normal; no masses,  no organomegaly  Disposition: 01-Home or Self Care  Discharge Orders    Future Appointments: Provider: Department: Dept Phone: Center:   08/03/2012 2:00 PM Krista Blue [facility] [phone] None   08/03/2012 2:30 PM Victorino December, MD [facility] [phone] None     Future Orders Please Complete By Expires   Diet general      Increase activity slowly      Call MD for:  temperature >100.4      Call MD for:  persistant nausea and vomiting      Call MD for:  severe uncontrolled pain      Call MD for:  redness, tenderness, or signs of infection (pain, swelling, redness, odor or green/yellow discharge around incision site)      Call MD for:  difficulty breathing, headache or visual disturbances      Discharge instructions      Comments:   Call for appointment for staple removal in 1 week ([phone])   Lifting restrictions      Comments:   No heavy lifting   Driving Restrictions      Comments:   No driving for 2 weeks     Medication  List  As of 03/21/2012  6:29 PM   TAKE these medications         amLODipine 10 MG tablet   Commonly known as: NORVASC   Take 10 mg by mouth daily.      benazepril-hydrochlorthiazide 20-25 MG per tablet   Commonly known as: LOTENSIN HCT   Take 1 tablet by mouth daily.      ferrous fumarate-iron polysaccharide complex 162-115.2 MG Caps   Commonly known as: TANDEM   Take 1 capsule by mouth daily with breakfast.      ibuprofen 600 MG tablet   Commonly known as: ADVIL,MOTRIN   Take 1 tablet (600 mg total) by mouth every 6 (six) hours as needed.      oxyCODONE-acetaminophen 5-325 MG per tablet   Commonly known as: PERCOCET/ROXICET   Take 2 tablets by mouth every 4 (four) hours as needed.      vitamin B-12 1000 MCG tablet   Commonly known as: CYANOCOBALAMIN   Take 1,000 mcg by mouth daily.             Signed: Retta Mac E 03/21/2012, 6:29 PM

## 2012-08-02 ENCOUNTER — Telehealth: Payer: Self-pay | Admitting: Medical Oncology

## 2012-08-02 NOTE — Telephone Encounter (Signed)
Patient Courtney Cox stating she received call reminding her of tomorrow's appt. Patient states she cannot make this appt and would like to reschedule. Will notify MD/NP and scheduling.

## 2012-08-02 NOTE — Telephone Encounter (Signed)
Reschedule to sometime in January with Mardella Layman

## 2012-08-03 ENCOUNTER — Telehealth: Payer: Self-pay | Admitting: Oncology

## 2012-08-03 ENCOUNTER — Other Ambulatory Visit: Payer: BC Managed Care – PPO | Admitting: Lab

## 2012-08-03 ENCOUNTER — Ambulatory Visit: Payer: BC Managed Care – PPO | Admitting: Adult Health

## 2012-08-03 NOTE — Telephone Encounter (Signed)
S/w the pt and she will call back to reschedule.

## 2012-11-06 LAB — HM DIABETES EYE EXAM

## 2014-08-08 ENCOUNTER — Encounter: Payer: Self-pay | Admitting: Family Medicine

## 2014-08-08 ENCOUNTER — Ambulatory Visit (INDEPENDENT_AMBULATORY_CARE_PROVIDER_SITE_OTHER): Payer: BC Managed Care – PPO | Admitting: Family Medicine

## 2014-08-08 ENCOUNTER — Encounter (INDEPENDENT_AMBULATORY_CARE_PROVIDER_SITE_OTHER): Payer: Self-pay

## 2014-08-08 VITALS — BP 130/88 | HR 81 | Temp 98.4°F | Ht 65.0 in | Wt 308.5 lb

## 2014-08-08 DIAGNOSIS — Z862 Personal history of diseases of the blood and blood-forming organs and certain disorders involving the immune mechanism: Secondary | ICD-10-CM | POA: Insufficient documentation

## 2014-08-08 DIAGNOSIS — E785 Hyperlipidemia, unspecified: Secondary | ICD-10-CM | POA: Insufficient documentation

## 2014-08-08 DIAGNOSIS — E559 Vitamin D deficiency, unspecified: Secondary | ICD-10-CM | POA: Insufficient documentation

## 2014-08-08 DIAGNOSIS — E1159 Type 2 diabetes mellitus with other circulatory complications: Secondary | ICD-10-CM | POA: Insufficient documentation

## 2014-08-08 DIAGNOSIS — E119 Type 2 diabetes mellitus without complications: Secondary | ICD-10-CM

## 2014-08-08 DIAGNOSIS — I10 Essential (primary) hypertension: Secondary | ICD-10-CM

## 2014-08-08 DIAGNOSIS — I152 Hypertension secondary to endocrine disorders: Secondary | ICD-10-CM | POA: Insufficient documentation

## 2014-08-08 DIAGNOSIS — E7800 Pure hypercholesterolemia, unspecified: Secondary | ICD-10-CM | POA: Insufficient documentation

## 2014-08-08 LAB — HM DIABETES FOOT EXAM

## 2014-08-08 NOTE — Progress Notes (Signed)
Pre visit review using our clinic review tool, if applicable. No additional management support is needed unless otherwise documented below in the visit note. 

## 2014-08-08 NOTE — Assessment & Plan Note (Signed)
Well controlled at last check 2 months ago.

## 2014-08-08 NOTE — Progress Notes (Signed)
Subjective:    Patient ID: Courtney Cox, female    DOB: [birth year], 48 y.o.   MRN: [id number]  HPI 48 year old female presents to establish.  Previous MD [facility]. Last OV 2 months ago for follow up labs.   Last CPX 6-02/2013.  She had breast and vaginal exam at that time.  Had abnormal PAP, recommended repeat pap, she never did this.  Diabetes:  Poor control per pt., Dx in last year. ? Last A1C   She is on glimperide 4 mg daily, she takes it once daily, but not at same time each day.  FBS 169 at MD  Metformin cause yeast infection. 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  Mother passed away a few months ago.  Ibuprofen or topical cream. Ongoing x 8 months.  left knee pain off and on. Improved with   Hypertension:  tribenzor  Has been on samples, improved control  BP Readings from Last 3 Encounters:  08/08/14 130/88  03/11/12 152/78  03/01/12 135/89  Using medication without problems or lightheadedness: None Chest pain with exertion:None Edema:None Short of breath:None Average home BPs: good control.Other issues:  High chol well controlled on crestor 40 mg no SE.  Review of Systems  Constitutional: Negative for fever, fatigue and unexpected weight change.  HENT: Negative for congestion, ear pain, sinus pressure, sneezing, sore throat and trouble swallowing.   Eyes: Negative for pain and itching.  Respiratory: Negative for cough, chest tightness, shortness of breath and wheezing.   Cardiovascular: Negative for chest pain, palpitations and leg swelling.  Gastrointestinal: Negative for nausea, abdominal pain, diarrhea, constipation and blood in stool.  Genitourinary: Negative for  dysuria, hematuria, vaginal discharge, difficulty urinating and menstrual problem.  Skin: Negative for rash.  Neurological: Negative for syncope, weakness, light-headedness, numbness and headaches.  Psychiatric/Behavioral: Negative for confusion and dysphoric mood. The patient is not nervous/anxious.        Objective:   Physical Exam  Constitutional: Vital signs are normal. She appears well-developed and well-nourished. She is cooperative.  Non-toxic appearance. She does not appear ill. No distress.  HENT:  Head: Normocephalic.  Right Ear: Hearing, tympanic membrane, external ear and ear canal normal. Tympanic membrane is not erythematous, not retracted and not bulging.  Left Ear: Hearing, tympanic membrane, external ear and ear canal normal. Tympanic membrane is not erythematous, not retracted and not bulging.  Nose: No mucosal edema or rhinorrhea. Right sinus exhibits no maxillary sinus tenderness and no frontal sinus tenderness. Left sinus exhibits no maxillary sinus tenderness and no frontal sinus tenderness.  Mouth/Throat: Uvula is midline, oropharynx is clear and moist and mucous membranes are normal.  Eyes: Conjunctivae, EOM and lids are normal. Pupils are equal, round, and reactive to light. Lids are everted and swept, no foreign bodies found.  Neck: Trachea normal and normal range of motion. Neck supple. Carotid bruit is not present. No thyroid mass and no thyromegaly present.  Cardiovascular: Normal rate, regular rhythm, S1 normal, S2 normal, normal heart sounds, intact distal pulses and normal pulses.  Exam reveals no gallop and no friction rub.   No murmur heard. Pulmonary/Chest: Effort normal and breath sounds normal. No tachypnea. No respiratory distress. She has no decreased breath sounds. She has  no wheezes. She has no rhonchi. She has no rales.  Abdominal: Soft. Normal appearance and bowel sounds are normal. There is no tenderness.  Neurological: She is alert.  Skin: Skin is  warm, dry and intact. No rash noted.  Psychiatric: Her speech is normal and behavior is normal. Judgment and thought content normal. Her mood appears not anxious. Cognition and memory are normal. She does not exhibit a depressed mood.   Diabetic foot exam: Normal inspection No skin breakdown No calluses  Normal DP pulses Normal sensation to light touch and monofilament Nails normal       Assessment & Plan:

## 2014-08-08 NOTE — Patient Instructions (Addendum)
Start taking glimepiride daily with a meal. Follow blood sugars at home FBS < 120, 2 hours after meals < 180.  Work on low carb diet, decrease pasta, rice, bread.. Instead whole wheat. Decrease sweets.  Start some type of exercise 3-5 times week.

## 2014-08-09 ENCOUNTER — Telehealth: Payer: Self-pay | Admitting: Family Medicine

## 2014-08-09 NOTE — Telephone Encounter (Signed)
emmi emailed °

## 2014-08-21 ENCOUNTER — Telehealth: Payer: Self-pay | Admitting: Family Medicine

## 2014-08-21 MED ORDER — OLMESARTAN-AMLODIPINE-HCTZ 40-5-25 MG PO TABS: 1.0000 | ORAL_TABLET | Freq: Every day | ORAL | Status: DC

## 2014-08-21 NOTE — Telephone Encounter (Signed)
Pt called requesting refill on Olmesartan-Amlodipine-HCTZ (TRIBENZOR) 40-5-25 MG TABS  [facility]

## 2014-08-21 NOTE — Telephone Encounter (Signed)
Notify pt refill sent

## 2014-08-25 ENCOUNTER — Encounter: Payer: Self-pay | Admitting: Family Medicine

## 2014-08-27 ENCOUNTER — Telehealth: Payer: Self-pay | Admitting: *Deleted

## 2014-08-27 NOTE — Telephone Encounter (Signed)
[facility] requesting prior [facility] for Tribenzor 40-5-25mg .  PA completed on CoverMyMeds.

## 2014-08-28 ENCOUNTER — Encounter: Payer: Self-pay | Admitting: Family Medicine

## 2014-08-28 MED ORDER — AMLODIPINE BESYLATE 5 MG PO TABS: 5.0000 mg | ORAL_TABLET | Freq: Every day | ORAL | Status: DC

## 2014-08-28 MED ORDER — LOSARTAN POTASSIUM-HCTZ 100-25 MG PO TABS: 1.0000 | ORAL_TABLET | Freq: Every day | ORAL | Status: DC

## 2014-08-28 NOTE — Telephone Encounter (Addendum)
PA denied.  Louie notified through [facility]. Will forward to Dr. Diona Browner to see if she wants to send in different medication.

## 2014-08-28 NOTE — Telephone Encounter (Signed)
Tribenzor 40/5/25 is essentially a combination of three med. We Tcan prescribe them in 2 differetn pills at similar  doses to get same result. Olemsartan component will be losartan instead.

## 2014-10-10 ENCOUNTER — Ambulatory Visit: Payer: BC Managed Care – PPO | Admitting: Family Medicine

## 2014-10-28 ENCOUNTER — Telehealth: Payer: Self-pay | Admitting: Family Medicine

## 2014-10-28 ENCOUNTER — Other Ambulatory Visit (INDEPENDENT_AMBULATORY_CARE_PROVIDER_SITE_OTHER): Payer: 59

## 2014-10-28 DIAGNOSIS — E559 Vitamin D deficiency, unspecified: Secondary | ICD-10-CM

## 2014-10-28 DIAGNOSIS — E119 Type 2 diabetes mellitus without complications: Secondary | ICD-10-CM

## 2014-10-28 DIAGNOSIS — E7800 Pure hypercholesterolemia, unspecified: Secondary | ICD-10-CM

## 2014-10-28 DIAGNOSIS — Z862 Personal history of diseases of the blood and blood-forming organs and certain disorders involving the immune mechanism: Secondary | ICD-10-CM

## 2014-10-28 LAB — CBC WITH DIFFERENTIAL/PLATELET
BASOS ABS: 0 10*3/uL (ref 0.0–0.1)
Basophils Relative: 0.3 % (ref 0.0–3.0)
Eosinophils Absolute: 0.2 10*3/uL (ref 0.0–0.7)
Eosinophils Relative: 2.1 % (ref 0.0–5.0)
HCT: 39.3 % (ref 36.0–46.0)
Hemoglobin: 12.9 g/dL (ref 12.0–15.0)
LYMPHS PCT: 22.7 % (ref 12.0–46.0)
Lymphs Abs: 2.4 10*3/uL (ref 0.7–4.0)
MCHC: 32.7 g/dL (ref 30.0–36.0)
MCV: 85.4 fl (ref 78.0–100.0)
Monocytes Absolute: 0.7 10*3/uL (ref 0.1–1.0)
Monocytes Relative: 7.1 % (ref 3.0–12.0)
Neutro Abs: 7.1 10*3/uL (ref 1.4–7.7)
Neutrophils Relative %: 67.8 % (ref 43.0–77.0)
PLATELETS: 370 10*3/uL (ref 150.0–400.0)
RBC: 4.61 Mil/uL (ref 3.87–5.11)
RDW: 15 % (ref 11.5–15.5)
WBC: 10.5 10*3/uL (ref 4.0–10.5)

## 2014-10-28 LAB — LIPID PANEL
Cholesterol: 208 mg/dL — ABNORMAL HIGH (ref 0–200)
HDL: 39.6 mg/dL (ref >39.00)
LDL CALC: 152 mg/dL — AB (ref 0–99)
NONHDL: 168.4
Total CHOL/HDL Ratio: 5
Triglycerides: 82 mg/dL (ref 0.0–149.0)
VLDL: 16.4 mg/dL (ref 0.0–40.0)

## 2014-10-28 LAB — COMPREHENSIVE METABOLIC PANEL
ALBUMIN: 3.9 g/dL (ref 3.5–5.2)
ALT: 25 U/L (ref 0–35)
AST: 18 U/L (ref 0–37)
Alkaline Phosphatase: 52 U/L (ref 39–117)
BUN: 9 mg/dL (ref 6–23)
CALCIUM: 9.3 mg/dL (ref 8.4–10.5)
CHLORIDE: 103 meq/L (ref 96–112)
CO2: 29 mEq/L (ref 19–32)
Creatinine, Ser: 0.79 mg/dL (ref 0.40–1.20)
GFR: 100.38 mL/min (ref >60.00)
Glucose, Bld: 150 mg/dL — ABNORMAL HIGH (ref 70–99)
POTASSIUM: 4.1 meq/L (ref 3.5–5.1)
SODIUM: 137 meq/L (ref 135–145)
TOTAL PROTEIN: 7.5 g/dL (ref 6.0–8.3)
Total Bilirubin: 0.4 mg/dL (ref 0.2–1.2)

## 2014-10-28 LAB — HEMOGLOBIN A1C: HEMOGLOBIN A1C: 8.1 % — AB (ref 4.6–6.5)

## 2014-10-28 NOTE — Telephone Encounter (Signed)
-----   Message from Ellamae Sia sent at 10/24/2014  2:06 PM EST ----- Regarding: Lab orders for Monday, 3.7.16 Patient is scheduled for CPX labs, please order future labs, Thanks , Karna Christmas

## 2014-10-29 LAB — VITAMIN D 25 HYDROXY (VIT D DEFICIENCY, FRACTURES): VITD: 28.61 ng/mL — AB (ref 30.00–100.00)

## 2014-10-31 ENCOUNTER — Ambulatory Visit (INDEPENDENT_AMBULATORY_CARE_PROVIDER_SITE_OTHER): Payer: 59 | Admitting: Family Medicine

## 2014-10-31 ENCOUNTER — Other Ambulatory Visit (HOSPITAL_COMMUNITY)
Admission: RE | Admit: 2014-10-31 | Discharge: 2014-10-31 | Disposition: A | Discharge status: Home / Self Care | Payer: 59 | Source: Ambulatory Visit | Attending: Family Medicine | Admitting: Family Medicine

## 2014-10-31 ENCOUNTER — Encounter: Payer: Self-pay | Admitting: Family Medicine

## 2014-10-31 VITALS — BP 139/98 | HR 83 | Temp 98.8°F | Ht 65.0 in | Wt 314.5 lb

## 2014-10-31 DIAGNOSIS — E559 Vitamin D deficiency, unspecified: Secondary | ICD-10-CM | POA: Diagnosis not present

## 2014-10-31 DIAGNOSIS — Z0000 Encounter for general adult medical examination without abnormal findings: Secondary | ICD-10-CM

## 2014-10-31 DIAGNOSIS — I10 Essential (primary) hypertension: Secondary | ICD-10-CM

## 2014-10-31 DIAGNOSIS — Z1151 Encounter for screening for human papillomavirus (HPV): Secondary | ICD-10-CM | POA: Insufficient documentation

## 2014-10-31 DIAGNOSIS — E7800 Pure hypercholesterolemia, unspecified: Secondary | ICD-10-CM

## 2014-10-31 DIAGNOSIS — E119 Type 2 diabetes mellitus without complications: Secondary | ICD-10-CM

## 2014-10-31 DIAGNOSIS — Z01419 Encounter for gynecological examination (general) (routine) without abnormal findings: Secondary | ICD-10-CM | POA: Insufficient documentation

## 2014-10-31 DIAGNOSIS — Z1272 Encounter for screening for malignant neoplasm of vagina: Secondary | ICD-10-CM

## 2014-10-31 LAB — HM DIABETES FOOT EXAM

## 2014-10-31 MED ORDER — GUAIFENESIN-CODEINE 100-10 MG/5ML PO SYRP: 5.0000 mL | ORAL_SOLUTION | Freq: Every evening | ORAL | Status: DC | PRN

## 2014-10-31 NOTE — Addendum Note (Signed)
Addended by: Carter Kitten on: 10/31/2014 12:13 PM   Modules accepted: Orders

## 2014-10-31 NOTE — Assessment & Plan Note (Signed)
Poor control. Pt wishes to not increase med and instead work aggressively on diet and weight loss. Recheck in 3 months.

## 2014-10-31 NOTE — Patient Instructions (Addendum)
Call to schedule nurse visit when able for pneumonia vaccine and tdap. Call to schedule mammogram on your own.  Try to take crestor 3-4 day a week.   Work on weight loss, exercise ( get on bike once week) , low carb and low fat diet. Schedule follow DM visit with labs prior in 3 months.

## 2014-10-31 NOTE — Assessment & Plan Note (Signed)
Well controlled. Continue current medication.  

## 2014-10-31 NOTE — Progress Notes (Addendum)
Subjective:    Patient ID: Courtney Cox, female    DOB: 04/14/1966, 49 y.o.   MRN: [id number]  HPI The patient is here for annual wellness exam and preventative care.    She has been having nasal congestion, cough, scratchy throat. Some chest pain with coughing. Mucinex DM helping, but cough keeping her up at night. No fever.  Last CPX 6-02/2013. She had breast and vaginal exam at that time. Had abnormal PAP, recommended repeat pap, she never did this.  Diabetes: Poor control on amaryl 4 mg daily. Lab Results  Component Value Date   HGBA1C 8.1* 10/28/2014  Metformin caused yeast infection. Using medications without difficulties: Hypoglycemic episodes:none Hyperglycemic episodes:occ after meals Feet problems: None Blood Sugars averaging:FBS 159-169 eye exam within last year: yes Poor diet, no exercise ( hard to do regularly, bought bike) Eating out a lot.  Wt Readings from Last 3 Encounters:  10/31/14 314 lb 8 oz (142.656 kg)  08/08/14 308 lb 8 oz (139.935 kg)  03/09/12 286 lb (129.729 kg)    Hypertension: Improved control on amlodipine 5 and hyzaar 100/25  mg BP Readings from Last 3 Encounters:  10/31/14 139/98  08/08/14 130/88  03/11/12 152/78  SE: None Chest pain with exertion:None Edema:None Short of breath:None Average home BPs: 132/89  High chol : LDL poorly controlled on crestor 40 mg no SE. She has not been taking regularly.. Only about 2 times a week.  Lab Results  Component Value Date   CHOL 208* 10/28/2014   HDL 39.60 10/28/2014   LDLCALC 152* 10/28/2014   TRIG 82.0 10/28/2014   CHOLHDL 5 10/28/2014   Vit D low.. On vit  D3 500 daily.           Review of Systems  Constitutional: Negative for fever and fatigue.  HENT: Negative for ear pain.   Eyes: Negative for pain.  Respiratory: Negative for chest tightness and shortness of breath.   Cardiovascular: Negative for chest pain, palpitations and leg swelling.  Gastrointestinal:  Negative for abdominal pain.  Genitourinary: Negative for dysuria.       Objective:   Physical Exam  Constitutional: Vital signs are normal. She appears well-developed and well-nourished. She is cooperative.  Non-toxic appearance. She does not appear ill. No distress.  HENT:  Head: Normocephalic.  Right Ear: Hearing, tympanic membrane, external ear and ear canal normal.  Left Ear: Hearing, tympanic membrane, external ear and ear canal normal.  Nose: Nose normal.  Eyes: Conjunctivae, EOM and lids are normal. Pupils are equal, round, and reactive to light. Lids are everted and swept, no foreign bodies found.  Neck: Trachea normal and normal range of motion. Neck supple. Carotid bruit is not present. No thyroid mass and no thyromegaly present.  Cardiovascular: Normal rate, regular rhythm, S1 normal, S2 normal, normal heart sounds and intact distal pulses.  Exam reveals no gallop.   No murmur heard. Pulmonary/Chest: Effort normal and breath sounds normal. No respiratory distress. She has no wheezes. She has no rhonchi. She has no rales.  Abdominal: Soft. Normal appearance and bowel sounds are normal. She exhibits no distension, no fluid wave, no abdominal bruit and no mass. There is no hepatosplenomegaly. There is no tenderness. There is no rebound, no guarding and no CVA tenderness. No hernia.  Genitourinary: Vagina normal. No breast swelling, tenderness, discharge or bleeding. Pelvic exam was performed with patient supine. There is no rash, tenderness or lesion on the right labia. There is no rash, tenderness or lesion  on the left labia. Right adnexum displays no mass, no tenderness and no fullness. Left adnexum displays no mass, no tenderness and no fullness.  No cervix, no uterus.  Lymphadenopathy:    She has no cervical adenopathy.    She has no axillary adenopathy.  Neurological: She is alert. She has normal strength. No cranial nerve deficit or sensory deficit.  Skin: Skin is warm, dry  and intact. No rash noted.  Psychiatric: Her speech is normal and behavior is normal. Judgment normal. Her mood appears not anxious. Cognition and memory are normal. She does not exhibit a depressed mood.     Diabetic foot exam: Normal inspection No skin breakdown No calluses  Normal DP pulses Normal sensation to light touch and monofilament Nails normal      Assessment & Plan:  The patient's preventative maintenance and recommended screening tests for an annual wellness exam were reviewed in full today. Brought up to date unless services declined.  Counselled on the importance of diet, exercise, and its role in overall health and mortality. The patient's FH and SH was reviewed, including their home life, tobacco status, and drug and alcohol status.   Vaccines; Due for pneumovax, Tdap (she will return for these), refused flu PAP/DVE: Due Mammo: Due HIV screen: refused. No family history with colon cancer.

## 2014-10-31 NOTE — Addendum Note (Signed)
Addended by: Eliezer Lofts E on: 10/31/2014 11:33 AM   Modules accepted: Orders, SmartSet

## 2014-10-31 NOTE — Assessment & Plan Note (Signed)
-

## 2014-10-31 NOTE — Assessment & Plan Note (Signed)
INadequate control, increase frequency of taking crestor. Encouraged exercise, weight loss, healthy eating habits.

## 2014-10-31 NOTE — Progress Notes (Signed)
Pre visit review using our clinic review tool, if applicable. No additional management support is needed unless otherwise documented below in the visit note. 

## 2014-11-01 LAB — CYTOLOGY - PAP

## 2015-01-08 ENCOUNTER — Encounter: Payer: Self-pay | Admitting: Family Medicine

## 2015-01-09 MED ORDER — ROSUVASTATIN CALCIUM 40 MG PO TABS
40.0000 mg | ORAL_TABLET | Freq: Every day | ORAL | Status: DC
Start: 2015-01-09 — End: 2016-06-14

## 2015-01-09 MED ORDER — GLIMEPIRIDE 4 MG PO TABS: 4.0000 mg | ORAL_TABLET | Freq: Every day | ORAL | Status: DC

## 2015-02-13 ENCOUNTER — Telehealth: Payer: Self-pay

## 2015-02-13 NOTE — Telephone Encounter (Signed)
Diabetic Bundle. Left voicemail advising pt her A1C blood test is due. Pt advised to contact PCP's office to schedule.  

## 2015-02-17 ENCOUNTER — Other Ambulatory Visit: Payer: Self-pay

## 2015-05-22 ENCOUNTER — Encounter: Payer: Self-pay | Admitting: Family Medicine

## 2015-05-22 NOTE — Telephone Encounter (Signed)
Pt left v/m requesting cb about fasting lab; spoke with pt and advised per my chart note. Pt voiced understanding.

## 2015-05-23 ENCOUNTER — Ambulatory Visit (INDEPENDENT_AMBULATORY_CARE_PROVIDER_SITE_OTHER): Payer: 59 | Admitting: Family Medicine

## 2015-05-23 ENCOUNTER — Encounter: Payer: Self-pay | Admitting: Family Medicine

## 2015-05-23 VITALS — BP 128/82 | HR 92 | Temp 98.1°F | Wt 306.5 lb

## 2015-05-23 DIAGNOSIS — E7800 Pure hypercholesterolemia, unspecified: Secondary | ICD-10-CM

## 2015-05-23 DIAGNOSIS — I10 Essential (primary) hypertension: Secondary | ICD-10-CM | POA: Diagnosis not present

## 2015-05-23 DIAGNOSIS — E119 Type 2 diabetes mellitus without complications: Secondary | ICD-10-CM | POA: Diagnosis not present

## 2015-05-23 DIAGNOSIS — Z23 Encounter for immunization: Secondary | ICD-10-CM

## 2015-05-23 LAB — HM DIABETES FOOT EXAM

## 2015-05-23 NOTE — Assessment & Plan Note (Signed)
Well controlled. Continue current medication.  

## 2015-05-23 NOTE — Assessment & Plan Note (Signed)
Due for re-eval. Likely a1c no t< 7.0.. Will likely increase to max amaryl. Pt agreeable. Counseled on increasing exercise and weight loss.

## 2015-05-23 NOTE — Patient Instructions (Addendum)
Stop at lab on way out. Keep working on healthy eating, weight loss and exercise.  Increase exercise to 3 days a week or 10 additional minutes each time.  Get yearly eye exam. Conjunctival hemorrhage will heal on its own.

## 2015-05-23 NOTE — Progress Notes (Signed)
49 year old female presents for 3-6 month DM follow up.  Diabetes: Due for re-eval.on amaryl 4 mg daily. Lab Results  Component Value Date   HGBA1C 8.1* 10/28/2014  Using medications without difficulties: Hypoglycemic episodes:none Hyperglycemic episodes:occ after meals Feet problems: None Blood Sugars averaging:FBS 169, at bedtime 142 eye exam within last year: none  Wt Readings from Last 3 Encounters:  05/23/15 306 lb 8 oz (139.027 kg)  10/31/14 314 lb 8 oz (142.656 kg)  08/08/14 308 lb 8 oz (139.935 kg)   Hypertension: [place] on amlodipine 5 and hyzaar 100/25 mg BP Readings from Last 3 Encounters:  05/23/15 128/82  10/31/14 139/98  08/08/14 130/88  SE: None Chest pain with exertion:None Edema:None Short of breath:None Average home BPs: 120/80  High chol : Due for re-eval.  Taking crestor   Recent Labs    Lab Results  Component Value Date   CHOL 208* 10/28/2014   HDL 39.60 10/28/2014   LDLCALC 152* 10/28/2014   TRIG 82.0 10/28/2014   CHOLHDL 5 10/28/2014     Exercise: bike at home... 2 times a week 20 min  Diet: low cholesterol lowcarb. More fruits and veggies.  less eating out.          Review of Systems  Constitutional: Negative for fever and fatigue.  HENT: Negative for ear pain.  Eyes: Negative for pain.  Respiratory: Negative for chest tightness and shortness of breath.  Cardiovascular: Negative for chest pain, palpitations and leg swelling.  Gastrointestinal: Negative for abdominal pain.  Genitourinary: Negative for dysuria.       Objective:   Physical Exam  Constitutional: Vital signs are normal. She appears well-developed and well-nourished. She is cooperative. Non-toxic appearance. She does not appear ill. No distress.  HENT:  Head: Normocephalic.  Right Ear: Hearing, tympanic membrane, external ear and ear canal normal.  Left Ear: Hearing, tympanic membrane, external ear and ear canal normal.   Nose: Nose normal.  Eyes: Conjunctivae, EOM and lids are normal. Pupils are equal, round, and reactive to light. Lids are everted and swept, no foreign bodies found.  Neck: Trachea normal and normal range of motion. Neck supple. Carotid bruit is not present. No thyroid mass and no thyromegaly present.  Cardiovascular: Normal rate, regular rhythm, S1 normal, S2 normal, normal heart sounds and intact distal pulses. Exam reveals no gallop.  No murmur heard. Pulmonary/Chest: Effort normal and breath sounds normal. No respiratory distress. She has no wheezes. She has no rhonchi. She has no rales.  Abdominal: Soft. Normal appearance and bowel sounds are normal. She exhibits no distension, no fluid wave, no abdominal bruit and no mass. There is no hepatosplenomegaly. There is no tenderness. There is no rebound, no guarding and no CVA tenderness. No hernia.  Lymphadenopathy:   She has no cervical adenopathy.   She has no axillary adenopathy.  Neurological: She is alert. She has normal strength. No cranial nerve deficit or sensory deficit.  Skin: Skin is warm, dry and intact. No rash noted.  Psychiatric: Her speech is normal and behavior is normal. Judgment normal. Her mood appears not anxious. Cognition and memory are normal. She does not exhibit a depressed mood.     Diabetic foot exam: Normal inspection No skin breakdown No calluses  Normal DP pulses Normal sensation to light touch and monofilament Nails normal

## 2015-05-23 NOTE — Progress Notes (Signed)
Pre visit review using our clinic review tool, if applicable. No additional management support is needed unless otherwise documented below in the visit note. 

## 2015-05-23 NOTE — Addendum Note (Signed)
Addended by: Despina Hidden on: 05/23/2015 03:39 PM   Modules accepted: Orders

## 2015-05-23 NOTE — Assessment & Plan Note (Signed)
Due for re-eval. Encouraged exercise, weight loss, healthy eating habits.  

## 2015-05-23 NOTE — Addendum Note (Signed)
Addended by: Eliezer Lofts E on: 05/23/2015 03:41 PM   Modules accepted: Orders

## 2015-05-24 LAB — HEMOGLOBIN A1C
Hgb A1c MFr Bld: 8.2 % — ABNORMAL HIGH (ref <5.7)
Mean Plasma Glucose: 189 mg/dL — ABNORMAL HIGH (ref <117)

## 2015-05-24 LAB — COMPREHENSIVE METABOLIC PANEL
ALT: 22 U/L (ref 6–29)
AST: 18 U/L (ref 10–35)
Albumin: 3.9 g/dL (ref 3.6–5.1)
Alkaline Phosphatase: 50 U/L (ref 33–115)
BILIRUBIN TOTAL: 0.5 mg/dL (ref 0.2–1.2)
BUN: 6 mg/dL — ABNORMAL LOW (ref 7–25)
CALCIUM: 9.6 mg/dL (ref 8.6–10.2)
CHLORIDE: 99 mmol/L (ref 98–110)
CO2: 27 mmol/L (ref 20–31)
CREATININE: 0.74 mg/dL (ref 0.50–1.10)
GLUCOSE: 111 mg/dL — AB (ref 65–99)
Potassium: 4 mmol/L (ref 3.5–5.3)
SODIUM: 139 mmol/L (ref 135–146)
Total Protein: 7.3 g/dL (ref 6.1–8.1)

## 2015-05-24 LAB — LIPID PANEL
Cholesterol: 201 mg/dL — ABNORMAL HIGH (ref 125–200)
HDL: 36 mg/dL — AB (ref >=46)
LDL Cholesterol: 148 mg/dL — ABNORMAL HIGH (ref <130)
Total CHOL/HDL Ratio: 5.6 Ratio — ABNORMAL HIGH (ref <=5.0)
Triglycerides: 83 mg/dL (ref <150)
VLDL: 17 mg/dL (ref <30)

## 2015-05-28 ENCOUNTER — Telehealth: Payer: Self-pay | Admitting: *Deleted

## 2015-05-28 MED ORDER — GLIMEPIRIDE 4 MG PO TABS: 8.0000 mg | ORAL_TABLET | Freq: Every day | ORAL | Status: DC

## 2015-05-28 NOTE — Telephone Encounter (Signed)
-----   Message from Helene Shoe, LPN sent at 05/01/8337  4:03 PM EDT ----- Pt left v/m requesting cb [phone]

## 2015-05-28 NOTE — Telephone Encounter (Signed)
Courtney Cox notified as instructed by telephone.  She will increase her Amaryl to 8 mg (2 tablets daily)  Refill with new directions and quantity sent to [facility].  She states she is not taking her Crestor everyday but will try to take it daily as prescribed.  She does not want to add any new cholesterol medication at this time.  She will call back to schedule 3 month follow up with fasting labs prior with Dr. Diona Browner.

## 2015-09-09 ENCOUNTER — Encounter: Payer: Self-pay | Admitting: *Deleted

## 2015-09-09 ENCOUNTER — Other Ambulatory Visit: Payer: Self-pay | Admitting: Family Medicine

## 2015-09-12 ENCOUNTER — Other Ambulatory Visit: Payer: Self-pay | Admitting: Family Medicine

## 2015-09-12 NOTE — Telephone Encounter (Signed)
Sent patient a MyChart message and she replied she would check her work calendar and call to schedule appointment next week.

## 2015-09-12 NOTE — Telephone Encounter (Signed)
Last office visit 05/23/2015.  [facility] states to follow up in 3 months.  No future appointments scheduled.  Refill?

## 2015-09-12 NOTE — Telephone Encounter (Signed)
Need DM follow up appt. Refill until then.

## 2015-10-11 ENCOUNTER — Other Ambulatory Visit: Payer: Self-pay | Admitting: Family Medicine

## 2015-10-17 ENCOUNTER — Encounter: Payer: Self-pay | Admitting: Family Medicine

## 2015-10-17 ENCOUNTER — Ambulatory Visit (INDEPENDENT_AMBULATORY_CARE_PROVIDER_SITE_OTHER): Payer: 59 | Admitting: Family Medicine

## 2015-10-17 VITALS — BP 140/86 | HR 105 | Temp 98.7°F | Ht 65.0 in | Wt 304.5 lb

## 2015-10-17 DIAGNOSIS — B351 Tinea unguium: Secondary | ICD-10-CM | POA: Insufficient documentation

## 2015-10-17 DIAGNOSIS — E119 Type 2 diabetes mellitus without complications: Secondary | ICD-10-CM

## 2015-10-17 DIAGNOSIS — I10 Essential (primary) hypertension: Secondary | ICD-10-CM

## 2015-10-17 DIAGNOSIS — J301 Allergic rhinitis due to pollen: Secondary | ICD-10-CM

## 2015-10-17 DIAGNOSIS — E7800 Pure hypercholesterolemia, unspecified: Secondary | ICD-10-CM

## 2015-10-17 DIAGNOSIS — E559 Vitamin D deficiency, unspecified: Secondary | ICD-10-CM

## 2015-10-17 DIAGNOSIS — J309 Allergic rhinitis, unspecified: Secondary | ICD-10-CM | POA: Insufficient documentation

## 2015-10-17 LAB — HM DIABETES FOOT EXAM

## 2015-10-17 MED ORDER — CICLOPIROX 8 % EX SOLN: Freq: Every day | CUTANEOUS | Status: DC

## 2015-10-17 NOTE — Patient Instructions (Addendum)
Stop at the lab on way out. Work on low carb diet, low salt (< 1500 mg per day), work on weight loss and  Get back to regular exercsie.  Continue to follow BP at home.. Call if consistently above 140/90. Start zyrtec at bedtime, no decongestant. Call if allergies are not improving.  Apply antifungal nail polish toe toes, will take a long time to improve.

## 2015-10-17 NOTE — Progress Notes (Signed)
Pre visit review using our clinic review tool, if applicable. No additional management support is needed unless otherwise documented below in the visit note. 

## 2015-10-17 NOTE — Assessment & Plan Note (Signed)
Treat with topical penlac.

## 2015-10-17 NOTE — Progress Notes (Signed)
50 year old female presents for 3-6 month DM follow up.  Diabetes: Due for re-eval.on amaryl 8 mg daily. No SE on higher dose. If blood sugar is still elevated she would like to try metfomin. Lab Results  Component Value Date   HGBA1C 8.2* 05/23/2015  Using medications without difficulties: Hypoglycemic episodes:none Hyperglycemic episodes:rare  Feet problems: None Blood Sugars averaging:FBS 142, at bedtime  eye exam within last year: has upcoming.  Wt Readings from Last 3 Encounters:  10/17/15 304 lb 8 oz (138.12 kg)  05/23/15 306 lb 8 oz (139.027 kg)  10/31/14 314 lb 8 oz (142.656 kg)   Hypertension: Borderline control on amlodipine 5 and hyzaar 100/25 mg.  She has been very stressed out today and in last week. BP Readings from Last 3 Encounters:  10/17/15 140/86  05/23/15 128/82  10/31/14 139/98  SE: None Chest pain with exertion:None Edema:None Short of breath:None Average home BPs: 132/84 this AM.  High chol : Due for re-eval. Taking crestor more regularly.  Recent Labs    Lab Results  Component Value Date   CHOL 208* 10/28/2014   HDL 39.60 10/28/2014   LDLCALC 152* 10/28/2014   TRIG 82.0 10/28/2014   CHOLHDL 5 10/28/2014    Exercise: none now. Diet: Low cholesterol low carb. More fruits and veggies.           Review of Systems  Constitutional: Negative for fever and fatigue.  HENT: Negative for ear pain. itchy watery eyes, runny nose, sneezing some.  Eyes: Negative for pain.  Respiratory: Negative for chest tightness and shortness of breath.  Cardiovascular: Negative for chest pain, palpitations and leg swelling.  Gastrointestinal: Negative for abdominal pain.  Genitourinary: Negative for dysuria.      Objective:  Physical Exam  Constitutional: Vital signs are normal. She appears well-developed and well-nourished. She is cooperative. Non-toxic appearance. She does not  appear ill. No distress.  HENT:  Head: Normocephalic.  Right Ear: Hearing, tympanic membrane, external ear and ear canal normal.  Left Ear: Hearing, tympanic membrane, external ear and ear canal normal.  Nose: Nose normal.  Eyes: Conjunctivae, EOM and lids are normal. Pupils are equal, round, and reactive to light. Lids are everted and swept, no foreign bodies found.  Neck: Trachea normal and normal range of motion. Neck supple. Carotid bruit is not present. No thyroid mass and no thyromegaly present.  Cardiovascular: Normal rate, regular rhythm, S1 normal, S2 normal, normal heart sounds and intact distal pulses. Exam reveals no gallop.  No murmur heard. Pulmonary/Chest: Effort normal and breath sounds normal. No respiratory distress. She has no wheezes. She has no rhonchi. She has no rales.  Abdominal: Soft. Normal appearance and bowel sounds are normal. She exhibits no distension, no fluid wave, no abdominal bruit and no mass. There is no hepatosplenomegaly. There is no tenderness. There is no rebound, no guarding and no CVA tenderness. No hernia.  Lymphadenopathy:   She has no cervical adenopathy.   She has no axillary adenopathy.  Neurological: She is alert. She has normal strength. No cranial nerve deficit or sensory deficit.  Skin: Skin is warm, dry and intact. No rash noted.  Psychiatric: Her speech is normal and behavior is normal. Judgment normal. Her mood appears not anxious. Cognition and memory are normal. She does not exhibit a depressed mood.     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

## 2015-10-17 NOTE — Assessment & Plan Note (Signed)
Treat with zyrtec at bedtime.

## 2015-10-18 LAB — LIPID PANEL
CHOL/HDL RATIO: 4.9 ratio (ref <=5.0)
CHOLESTEROL: 186 mg/dL (ref 125–200)
HDL: 38 mg/dL — ABNORMAL LOW (ref >=46)
LDL Cholesterol: 131 mg/dL — ABNORMAL HIGH (ref <130)
TRIGLYCERIDES: 85 mg/dL (ref <150)
VLDL: 17 mg/dL (ref <30)

## 2015-10-18 LAB — COMPREHENSIVE METABOLIC PANEL
ALBUMIN: 3.9 g/dL (ref 3.6–5.1)
ALK PHOS: 48 U/L (ref 33–115)
ALT: 17 U/L (ref 6–29)
AST: 14 U/L (ref 10–35)
BILIRUBIN TOTAL: 0.4 mg/dL (ref 0.2–1.2)
BUN: 10 mg/dL (ref 7–25)
CALCIUM: 9.7 mg/dL (ref 8.6–10.2)
CO2: 27 mmol/L (ref 20–31)
Chloride: 104 mmol/L (ref 98–110)
Creat: 0.77 mg/dL (ref 0.50–1.10)
GLUCOSE: 119 mg/dL — AB (ref 65–99)
POTASSIUM: 3.9 mmol/L (ref 3.5–5.3)
Sodium: 142 mmol/L (ref 135–146)
TOTAL PROTEIN: 7.4 g/dL (ref 6.1–8.1)

## 2015-10-18 LAB — VITAMIN D 25 HYDROXY (VIT D DEFICIENCY, FRACTURES): Vit D, 25-Hydroxy: 27 ng/mL — ABNORMAL LOW (ref 30–100)

## 2015-10-18 LAB — HEMOGLOBIN A1C
HEMOGLOBIN A1C: 7.7 % — AB (ref <5.7)
MEAN PLASMA GLUCOSE: 174 mg/dL — AB (ref <117)

## 2015-10-20 ENCOUNTER — Encounter: Payer: Self-pay | Admitting: *Deleted

## 2015-10-20 ENCOUNTER — Other Ambulatory Visit: Payer: Self-pay | Admitting: Family Medicine

## 2015-10-20 ENCOUNTER — Other Ambulatory Visit: Payer: Self-pay | Admitting: *Deleted

## 2015-10-20 MED ORDER — METFORMIN HCL ER (MOD) 500 MG PO TB24: 500.0000 mg | ORAL_TABLET | Freq: Every day | ORAL | Status: DC

## 2015-10-20 MED ORDER — METFORMIN HCL ER 500 MG PO TB24: 500.0000 mg | ORAL_TABLET | Freq: Every day | ORAL | Status: DC

## 2015-10-20 MED ORDER — VITAMIN D (ERGOCALCIFEROL) 1.25 MG (50000 UNIT) PO CAPS: 50000.0000 [IU] | ORAL_CAPSULE | ORAL | Status: DC

## 2015-10-20 NOTE — Telephone Encounter (Signed)
[facility] stating insurance will not cover Glumetza and ask that we send in Rx for Glucophage XR instead.  Rx sent as requested.  Okay per Dr. Diona Browner.

## 2015-12-02 ENCOUNTER — Other Ambulatory Visit: Payer: Self-pay | Admitting: Family Medicine

## 2016-01-05 ENCOUNTER — Other Ambulatory Visit: Payer: Self-pay | Admitting: Family Medicine

## 2016-02-03 ENCOUNTER — Encounter: Payer: Self-pay | Admitting: Family Medicine

## 2016-02-19 ENCOUNTER — Ambulatory Visit: Payer: 59 | Admitting: Family Medicine

## 2016-03-10 ENCOUNTER — Other Ambulatory Visit: Payer: Self-pay | Admitting: Family Medicine

## 2016-03-22 ENCOUNTER — Encounter: Payer: 59 | Admitting: Family Medicine

## 2016-04-09 ENCOUNTER — Encounter (HOSPITAL_COMMUNITY): Payer: Self-pay

## 2016-04-09 ENCOUNTER — Emergency Department (HOSPITAL_COMMUNITY): Payer: 59

## 2016-04-09 ENCOUNTER — Telehealth: Payer: Self-pay | Admitting: Family Medicine

## 2016-04-09 ENCOUNTER — Emergency Department (HOSPITAL_COMMUNITY)
Admission: EM | Admit: 2016-04-09 | Discharge: 2016-04-10 | Disposition: A | Discharge status: Home / Self Care | Payer: 59 | Attending: Emergency Medicine | Admitting: Emergency Medicine

## 2016-04-09 DIAGNOSIS — Z79899 Other long term (current) drug therapy: Secondary | ICD-10-CM | POA: Insufficient documentation

## 2016-04-09 DIAGNOSIS — E119 Type 2 diabetes mellitus without complications: Secondary | ICD-10-CM | POA: Diagnosis not present

## 2016-04-09 DIAGNOSIS — R0789 Other chest pain: Secondary | ICD-10-CM | POA: Diagnosis not present

## 2016-04-09 DIAGNOSIS — I10 Essential (primary) hypertension: Secondary | ICD-10-CM | POA: Diagnosis not present

## 2016-04-09 DIAGNOSIS — Z7982 Long term (current) use of aspirin: Secondary | ICD-10-CM | POA: Insufficient documentation

## 2016-04-09 DIAGNOSIS — R0602 Shortness of breath: Secondary | ICD-10-CM | POA: Insufficient documentation

## 2016-04-09 LAB — TROPONIN I: Troponin I: <0.03 ng/mL (ref <0.03)

## 2016-04-09 LAB — D-DIMER, QUANTITATIVE: D-Dimer, Quant: <0.27 ug/mL-FEU (ref 0.00–0.50)

## 2016-04-09 LAB — CBC
HEMATOCRIT: 40.3 % (ref 36.0–46.0)
Hemoglobin: 13 g/dL (ref 12.0–15.0)
MCH: 27.5 pg (ref 26.0–34.0)
MCHC: 32.3 g/dL (ref 30.0–36.0)
MCV: 85.4 fL (ref 78.0–100.0)
PLATELETS: 402 10*3/uL — AB (ref 150–400)
RBC: 4.72 MIL/uL (ref 3.87–5.11)
RDW: 14.3 % (ref 11.5–15.5)
WBC: 12.8 10*3/uL — ABNORMAL HIGH (ref 4.0–10.5)

## 2016-04-09 LAB — BASIC METABOLIC PANEL
Anion gap: 8 (ref 5–15)
BUN: 11 mg/dL (ref 6–20)
CALCIUM: 9.4 mg/dL (ref 8.9–10.3)
CO2: 26 mmol/L (ref 22–32)
CREATININE: 0.77 mg/dL (ref 0.44–1.00)
Chloride: 103 mmol/L (ref 101–111)
GFR calc Af Amer: >60 mL/min (ref >60)
GLUCOSE: 206 mg/dL — AB (ref 65–99)
Potassium: 3.5 mmol/L (ref 3.5–5.1)
Sodium: 137 mmol/L (ref 135–145)

## 2016-04-09 MED ORDER — ASPIRIN 81 MG PO CHEW
324.0000 mg | CHEWABLE_TABLET | Freq: Once | ORAL | Status: AC
  Administered 2016-04-09: 324 mg via ORAL
  Filled 2016-04-09: qty 4

## 2016-04-09 NOTE — ED Provider Notes (Signed)
[facility] DEPT Provider Note   CSN: [id number] Arrival date & time: 04/09/16  1916     History   Chief Complaint Chief Complaint  Patient presents with  . Chest Pain    HPI Courtney Cox is a 50 y.o. female.  50 yo female with dm, htn, anemia, lipids, FH heart dz, non smoker presents with chest pressure constant since last night.  Different than previous. No radiation. No fevers.  NO cardiac hx known.  No exertional hx, no pe risks.    The history is provided by the patient.  Chest Pain   Associated symptoms include shortness of breath. Pertinent negatives include no abdominal pain, no back pain, no fever, no headaches and no vomiting.    Past Medical History:  Diagnosis Date  . Anemia 2008   iron deficiency  . Diabetes mellitus    on watch,not on meds  . History of blood transfusion 02/18/12   received 2 uprbc for hgb 6.5  . History of blood transfusion   . Hyperlipidemia   . Hypertension   . 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 Marcy Panning @ [facility]    Patient Active Problem List   Diagnosis Date Noted  . Toenail fungus 10/17/2015  . Allergic rhinitis due to allergen 10/17/2015  . Diabetes mellitus with no complication, poor control. 08/08/2014  . Benign hypertension 08/08/2014  . High cholesterol 08/08/2014  . Vitamin D deficiency 08/08/2014  . Hx of iron deficiency anemia 08/08/2014    Past Surgical History:  Procedure Laterality Date  . ABDOMINAL HYSTERECTOMY  03/09/2012   Procedure: HYSTERECTOMY ABDOMINAL;  Surgeon: Allena Katz, MD;  Location: [facility];  Service: Gynecology;  Laterality: N/A;   TOTAL ABDOMINAL HYSTERECTOMY  . HYSTEROSCOPY W/ ENDOMETRIAL HYDROTHERMAL ABLATION  12-31-2008   MENOMETRORRHAGIA  . LAPAROSCOPIC ASSISTED VAGINAL HYSTERECTOMY  03/09/2012   Procedure: LAPAROSCOPIC ASSISTED VAGINAL HYSTERECTOMY;  Surgeon: Allena Katz, MD;  Location: [facility];  Service: Gynecology;  Laterality: N/A;  Attempted but had to do abdominal hysterectomy    OB History    No data available       Home Medications    Prior to Admission medications   Medication Sig Start Date End Date Taking? Authorizing Provider  amLODipine (NORVASC) 5 MG tablet TAKE 1 TABLET BY MOUTH ONCE A DAY 01/05/16  Yes Amy E Bedsole, MD  aspirin 81 MG tablet Take 81 mg by mouth daily.   Yes Historical Provider, MD  glimepiride (AMARYL) 4 MG tablet TAKE 2 TABLETS (8 MG TOTAL) BY MOUTH DAILY WITH BREAKFAST. Patient taking differently: TAKE 1 to 2 TABLETS (8 MG TOTAL) BY MOUTH DAILY WITH BREAKFAST. 03/10/16  Yes Amy Cletis Athens, MD  losartan-hydrochlorothiazide (HYZAAR) 100-25 MG tablet TAKE 1 TABLET BY MOUTH DAILY. 12/02/15  Yes Amy Cletis Athens, MD  rosuvastatin (CRESTOR) 40 MG tablet Take 1 tablet (40 mg total) by mouth daily. 01/09/15  Yes Amy Cletis Athens, MD    Family History Family History  Problem Relation Age of Onset  . Diabetes Father   . Heart disease Father   . Hypertension Father   . Heart disease Mother   . Diabetes Mother   . Diabetes Sister   . Diabetes Maternal Grandmother   . Diabetes Maternal Grandfather   . Diabetes Paternal Grandmother   . Diabetes Paternal Grandfather     Social History Social History  Substance Use Topics  . Smoking  status: Never Smoker  . Smokeless tobacco: Never Used  . Alcohol use 0.0 oz/week     Comment: socially     Allergies   Review of patient's allergies indicates no known allergies.   Review of Systems Review of Systems  Constitutional: Negative for chills and fever.  HENT: Negative for congestion.   Eyes: Negative for visual disturbance.  Respiratory: Positive for shortness of breath.   Cardiovascular: Positive for chest pain.  Gastrointestinal: Negative for abdominal pain and vomiting.  Genitourinary: Negative for dysuria and flank pain.  Musculoskeletal: Negative for back pain, neck pain and neck  stiffness.  Skin: Negative for rash.  Neurological: Negative for light-headedness and headaches.     Physical Exam Updated Vital Signs BP 121/55   Pulse 97   Temp 98.1 F (36.7 C) (Oral)   Resp 11   Ht 5\' 5"  (1.651 m)   Wt 298 lb (135.2 kg)   LMP 02/07/2012   SpO2 96%   BMI 49.59 kg/m   Physical Exam  Constitutional: She is oriented to person, place, and time. She appears well-developed and well-nourished.  HENT:  Head: Normocephalic and atraumatic.  Eyes: Conjunctivae are normal. Right eye exhibits no discharge. Left eye exhibits no discharge.  Neck: Normal range of motion. Neck supple. No tracheal deviation present.  Cardiovascular: Normal rate, regular rhythm and intact distal pulses.   No murmur heard. Pulmonary/Chest: Effort normal and breath sounds normal.  Abdominal: Soft. She exhibits no distension. There is no tenderness. There is no guarding.  Musculoskeletal: She exhibits no edema.  Neurological: She is alert and oriented to person, place, and time.  Skin: Skin is warm. No rash noted.  Psychiatric: She has a normal mood and affect.  Nursing note and vitals reviewed.    ED Treatments / Results  Labs (all labs ordered are listed, but only abnormal results are displayed) Labs Reviewed  BASIC METABOLIC PANEL - Abnormal; Notable for the following:       Result Value   Glucose, Bld 206 (*)    All other components within normal limits  CBC - Abnormal; Notable for the following:    WBC 12.8 (*)    Platelets 402 (*)    All other components within normal limits  TROPONIN I  D-DIMER, QUANTITATIVE (NOT AT [facility])  TROPONIN I    EKG  EKG Interpretation  Date/Time:  Friday April 09 2016 19:28:56 EDT Ventricular Rate:  109 PR Interval:    QRS Duration: 96 QT Interval:  338 QTC Calculation: 456 R Axis:   11 Text Interpretation:  Sinus tachycardia similar previous Reconfirmed by Gilford Lardizabal MD, Vonna Kotyk [id number]) on 04/09/2016 10:14:23 PM       Radiology No  results found.  Procedures Procedures (including critical care time)  Medications Ordered in ED Medications  aspirin chewable tablet 324 mg (324 mg Oral Given 04/09/16 2040)     Initial Impression / Assessment and Plan / ED Course  I have reviewed the triage vital signs and the nursing notes.  Pertinent labs & imaging results that were available during my care of the patient were reviewed by me and considered in my medical decision making (see chart for details).  Clinical Course   Pt with constant chest pressure since last night.  Well appearing in ED.   Pt risk PE, mild tachy, ddimer sent. Pt low-moderate cardiac, plan for delta troponin and outpt stress if neg.  Pt improved in ed.  Results and differential diagnosis were discussed with the patient/parent/guardian.  Xrays were independently reviewed by myself.  Close follow up outpatient was discussed, comfortable with the plan.   Medications  aspirin chewable tablet 324 mg (324 mg Oral Given 04/09/16 2040)    Vitals:   04/09/16 2230 04/09/16 2300 04/09/16 2330 04/10/16 0000  BP: 133/65 133/62 118/75 121/55  Pulse: 104 100 100 97  Resp: 24 20 20 11   Temp:      TempSrc:      SpO2: 96% 96% 99% 96%  Weight:      Height:        Final diagnoses:  Other chest pain     Final Clinical Impressions(s) / ED Diagnoses   Final diagnoses:  Other chest pain    New Prescriptions Discharge Medication List as of 04/10/2016 12:13 AM       Elnora Morrison, MD 04/17/16 0028

## 2016-04-09 NOTE — Telephone Encounter (Signed)
[facility] Patient Name: Nayleah Allegretto DOB: Aug 16, 1966 Initial Comment Caller states she is having chest tightness and SOB. Nurse Assessment Nurse: Dimas Chyle, RN, Dellis Filbert Date/Time Eilene Ghazi Time): 04/09/2016 10:36:09 AM Confirm and document reason for call. If symptomatic, describe symptoms. You must click the next button to save text entered. ---Caller states she is having chest tightness and SOB. Symptoms started this morning. Has the patient traveled out of the country within the last 30 days? ---No Does the patient have any new or worsening symptoms? ---Yes Will a triage be completed? ---Yes Related visit to physician within the last 2 weeks? ---No Does the PT have any chronic conditions? (i.e. diabetes, asthma, etc.) ---Yes List chronic conditions. ---HTN, Diabetes type 2 Is the patient pregnant or possibly pregnant? (Ask all females between the ages of 84-55) ---No Is this a behavioral health or substance abuse call? ---No Guidelines Guideline Title Affirmed Question Affirmed Notes Chest Pain Difficulty breathing Final Disposition User Go to ED Now Dimas Chyle, RN, [facility] - ED Disagree/Comply: Comply

## 2016-04-09 NOTE — Telephone Encounter (Signed)
Agreed -

## 2016-04-09 NOTE — Discharge Instructions (Signed)
Schedule stress test with your primary doctor or cardiology.  If you were given medicines take as directed.  If you are on coumadin or contraceptives realize their levels and effectiveness is altered by many different medicines.  If you have any reaction (rash, tongues swelling, other) to the medicines stop taking and see a physician.    If your blood pressure was elevated in the ER make sure you follow up for management with a primary doctor or return for chest pain, shortness of breath or stroke symptoms.  Please follow up as directed and return to the ER or see a physician for new or worsening symptoms.  Thank you. Vitals:   04/09/16 2030 04/09/16 2035 04/09/16 2100 04/09/16 2130  BP: 142/73 126/60 129/84 130/69  Pulse:  110 110 102  Resp: 25 21 21 19   Temp:      TempSrc:      SpO2:  96% 96% 95%  Weight:      Height:

## 2016-04-09 NOTE — ED Triage Notes (Signed)
Patient complaining of chest pain and pressure that started last night.

## 2016-04-10 LAB — TROPONIN I

## 2016-05-22 ENCOUNTER — Encounter (HOSPITAL_COMMUNITY): Payer: Self-pay | Admitting: Emergency Medicine

## 2016-05-22 ENCOUNTER — Emergency Department (HOSPITAL_COMMUNITY): Payer: 59

## 2016-05-22 ENCOUNTER — Emergency Department (HOSPITAL_COMMUNITY)
Admission: EM | Admit: 2016-05-22 | Discharge: 2016-05-22 | Disposition: A | Discharge status: Home / Self Care | Payer: 59 | Attending: Emergency Medicine | Admitting: Emergency Medicine

## 2016-05-22 DIAGNOSIS — R1032 Left lower quadrant pain: Secondary | ICD-10-CM | POA: Insufficient documentation

## 2016-05-22 DIAGNOSIS — Z79899 Other long term (current) drug therapy: Secondary | ICD-10-CM | POA: Insufficient documentation

## 2016-05-22 DIAGNOSIS — E119 Type 2 diabetes mellitus without complications: Secondary | ICD-10-CM | POA: Insufficient documentation

## 2016-05-22 DIAGNOSIS — R109 Unspecified abdominal pain: Secondary | ICD-10-CM

## 2016-05-22 DIAGNOSIS — Z7982 Long term (current) use of aspirin: Secondary | ICD-10-CM | POA: Insufficient documentation

## 2016-05-22 DIAGNOSIS — I10 Essential (primary) hypertension: Secondary | ICD-10-CM | POA: Insufficient documentation

## 2016-05-22 LAB — URINALYSIS, ROUTINE W REFLEX MICROSCOPIC
BILIRUBIN URINE: NEGATIVE
Glucose, UA: NEGATIVE mg/dL
Hgb urine dipstick: NEGATIVE
KETONES UR: NEGATIVE mg/dL
Leukocytes, UA: NEGATIVE
NITRITE: NEGATIVE
PROTEIN: NEGATIVE mg/dL
Specific Gravity, Urine: 1.01 (ref 1.005–1.030)
pH: 7.5 (ref 5.0–8.0)

## 2016-05-22 MED ORDER — CEPHALEXIN 250 MG PO CAPS: 250.0000 mg | ORAL_CAPSULE | Freq: Four times a day (QID) | ORAL | 0 refills | Status: DC

## 2016-05-22 MED ORDER — IBUPROFEN 800 MG PO TABS: 800.0000 mg | ORAL_TABLET | Freq: Four times a day (QID) | ORAL | 0 refills | Status: DC | PRN

## 2016-05-22 NOTE — ED Triage Notes (Addendum)
Patient c/o left flank pain that started 2 days ago. Denies any urinary symptoms, nausea, or vomiting. When asked about fevers patient states that the site was warm to touch last night. Denies any rash. Worse with movement and lifting left arm.

## 2016-05-22 NOTE — ED Notes (Signed)
IV is patient but positional.

## 2016-05-22 NOTE — ED Notes (Signed)
PA at bedside.

## 2016-05-22 NOTE — ED Provider Notes (Signed)
The patient is a 50 year old female, she is very obese, she states that she has had a couple of days of a mild pain is located just anterior and medial to her left iliac crest, she denies that this is related to eating, she has no gastrointestinal symptoms, no urinary symptoms, she did take 2 doses of her husband's Keflex, on exam the patient is very obese, she has minimal reproducible tenderness in the left lower quadrant, there is no guarding or masses, again she is not had any vomiting, blood in her stools or diarrhea to suggest that this is related to either ischemic bowel, bowel obstruction, incarcerated hernia or other significant process. She has had a prior hysterectomy, she states she no longer has ovaries, this makes genitourinary pathology less likely other than possible urinary infection or kidney stone however her urine sample was sterile and without any blood here. She could have a sterile urine sample due to related recent antibiotic use we will complete a 5 day course of Keflex, otherwise well-appearing on anti-inflammatories she should be able to be discharged home to follow-up if her symptoms worsen. CT scan is not working at this time, I had a discussion with her on waiting for the CT scan to be functional, she has declined and request to be discharged home and to return should her symptoms worsen. I'm in agreement with this plan. She is stable for discharge. Both she and her husband expressed her understanding to the indications for return and the treatment plan.  Medical screening examination/treatment/procedure(s) were conducted as a shared visit with non-physician practitioner(s) and myself.  I personally evaluated the patient during the encounter.  Clinical Impression:   Final diagnoses:  Abdominal pain, unspecified abdominal location         Noemi Chapel, MD 05/22/16 1614

## 2016-05-22 NOTE — ED Provider Notes (Signed)
[facility] DEPT Provider Note   CSN: [id number] Arrival date & time: 05/22/16  0947     History   Chief Complaint Chief Complaint  Patient presents with  . Flank Pain    HPI Courtney Cox is a 50 y.o. female.  Patient is 50 yo F with multiple chronic medical conditions as listed below (well controlled with medication), only surgical hx of hysterectomy, presenting with left lower abdominal pain that has been worsening over the past 2 days. She describes the pain as a dull ache, with no aggravating or relieving factors. She denies any recent infection or urinary symptoms, but took 2 doses of her fiance's Keflex. Denies fever, chills, nausea, vomiting, change in bowel habits, blood is stools, dysuria, or discharge. She denies the pain is related to eating and has no change in appetite. Denies any hx of hernia, abdominal wall defects, or recent strenuous activity.      Past Medical History:  Diagnosis Date  . Anemia 2008   iron deficiency  . Diabetes mellitus    on watch,not on meds  . History of blood transfusion 02/18/12   received 2 uprbc for hgb 6.5  . History of blood transfusion   . Hyperlipidemia   . Hypertension   . 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 Marcy Panning @ [facility]    Patient Active Problem List   Diagnosis Date Noted  . Toenail fungus 10/17/2015  . Allergic rhinitis due to allergen 10/17/2015  . Diabetes mellitus with no complication, poor control. 08/08/2014  . Benign hypertension 08/08/2014  . High cholesterol 08/08/2014  . Vitamin D deficiency 08/08/2014  . Hx of iron deficiency anemia 08/08/2014    Past Surgical History:  Procedure Laterality Date  . ABDOMINAL HYSTERECTOMY  03/09/2012   Procedure: HYSTERECTOMY ABDOMINAL;  Surgeon: Allena Katz, MD;  Location: [facility];  Service: Gynecology;  Laterality: N/A;   TOTAL ABDOMINAL HYSTERECTOMY  . HYSTEROSCOPY W/ ENDOMETRIAL  HYDROTHERMAL ABLATION  12-31-2008   MENOMETRORRHAGIA  . LAPAROSCOPIC ASSISTED VAGINAL HYSTERECTOMY  03/09/2012   Procedure: LAPAROSCOPIC ASSISTED VAGINAL HYSTERECTOMY;  Surgeon: Allena Katz, MD;  Location: [facility];  Service: Gynecology;  Laterality: N/A;  Attempted but had to do abdominal hysterectomy    OB History    Gravida Para Term Preterm AB Living   4 3 3   1 3    SAB TAB Ectopic Multiple Live Births   1               Home Medications    Prior to Admission medications   Medication Sig Start Date End Date Taking? Authorizing Provider  amLODipine (NORVASC) 5 MG tablet TAKE 1 TABLET BY MOUTH ONCE A DAY 01/05/16   Amy E Diona Browner, MD  aspirin 81 MG tablet Take 81 mg by mouth daily.    Historical Provider, MD  glimepiride (AMARYL) 4 MG tablet TAKE 2 TABLETS (8 MG TOTAL) BY MOUTH DAILY WITH BREAKFAST. Patient taking differently: TAKE 1 to 2 TABLETS (8 MG TOTAL) BY MOUTH DAILY WITH BREAKFAST. 03/10/16   Amy Cletis Athens, MD  losartan-hydrochlorothiazide (HYZAAR) 100-25 MG tablet TAKE 1 TABLET BY MOUTH DAILY. 12/02/15   Amy Cletis Athens, MD  rosuvastatin (CRESTOR) 40 MG tablet Take 1 tablet (40 mg total) by mouth daily. 01/09/15   Amy Cletis Athens, MD    Family History Family History  Problem Relation Age of Onset  . Diabetes Father   .  Heart disease Father   . Hypertension Father   . Heart disease Mother   . Diabetes Mother   . Diabetes Sister   . Diabetes Maternal Grandmother   . Diabetes Maternal Grandfather   . Diabetes Paternal Grandmother   . Diabetes Paternal Grandfather     Social History Social History  Substance Use Topics  . Smoking status: Never Smoker  . Smokeless tobacco: Never Used  . Alcohol use 0.0 oz/week     Comment: socially     Allergies   Review of patient's allergies indicates no known allergies.   Review of Systems Review of Systems  Constitutional: Negative for chills and fever.  Respiratory: Negative for cough and shortness  of breath.   Cardiovascular: Negative for chest pain and palpitations.  Gastrointestinal: Positive for abdominal pain. Negative for blood in stool, constipation, diarrhea, nausea and vomiting.  Genitourinary: Negative for dysuria, flank pain, hematuria, pelvic pain and vaginal discharge.  Musculoskeletal: Negative for back pain and neck pain.  Skin: Negative for color change and rash.  Neurological: Negative for dizziness, seizures, syncope and headaches.  All other systems reviewed and are negative.    Physical Exam Updated Vital Signs BP 160/97 (BP Location: Left Arm)   Pulse 106   Temp 99 F (37.2 C) (Oral)   Resp 18   Ht 5\' 5"  (1.651 m)   Wt 135.2 kg   LMP 02/07/2012   SpO2 95%   BMI 49.59 kg/m   Physical Exam  Constitutional: She appears well-developed and well-nourished. No distress.  Morbidly obese female, lying on stretcher, in no acute distress  HENT:  Head: Normocephalic and atraumatic.  Mouth/Throat: Oropharynx is clear and moist.  Eyes: Conjunctivae are normal.  Neck: Normal range of motion. Neck supple.  Cardiovascular: Regular rhythm, normal heart sounds and intact distal pulses.   Tachycardic  Pulmonary/Chest: Effort normal and breath sounds normal. No respiratory distress.  Abdominal: Soft. Bowel sounds are normal.  Mild tenderness to left lateral abdominal wall. Non tender RLQ or RUQ. Negative McBurney's and Murphy's sign. No CVA tenderness. No hernia or masses felt, but given patient's body habitus, difficult to assess.  Musculoskeletal: Normal range of motion. She exhibits no edema or tenderness.  Neurological: She is alert.  Skin: Skin is warm and dry.  Psychiatric: She has a normal mood and affect.  Nursing note and vitals reviewed.    ED Treatments / Results  Labs (all labs ordered are listed, but only abnormal results are displayed) Labs Reviewed - No data to display  EKG  EKG Interpretation None       Radiology No results  found.  Procedures Procedures (including critical care time)  Medications Ordered in ED Medications - No data to display   Initial Impression / Assessment and Plan / ED Course  I have reviewed the triage vital signs and the nursing notes.  Pertinent labs & imaging results that were available during my care of the patient were reviewed by me and considered in my medical decision making (see chart for details).  Clinical Course    Patient is 50 yo F presenting with non-specific, dull abdominal pain over the past 2 days. She is morbidly obese, but physical exam was unremarkable other than mild tenderness at left lateral abdominal wall. No complaints of flank pain or dysuria, and urinalysis negative for leukocytes or Hgb, making UTI or kidney stone unlikely diagnosis. Sterile specimen may be due to 2 doses of Keflex she took from her fiance. CT abdomen  and pelvis w/o contrast initially ordered; however, CT scanner was not working. Attending physician, Dr. Noemi Chapel, examined patient and determined patient stable for d/c without scan due to benign abdominal exam. 5 day course of Keflex prescribed since she started taking antibiotics, as well as ibuprofen 800 mg for pain. Strict return precautions to ED discussed including worsening abdominal pain, vomiting, and fever.  Final Clinical Impressions(s) / ED Diagnoses   Final diagnoses:  Abdominal pain, unspecified abdominal location    New Prescriptions Discharge Medication List as of 05/22/2016 12:27 PM    START taking these medications   Details  cephALEXin (KEFLEX) 250 MG capsule Take 1 capsule (250 mg total) by mouth 4 (four) times daily., Starting Sat 05/22/2016, Print    ibuprofen (ADVIL,MOTRIN) 800 MG tablet Take 1 tablet (800 mg total) by mouth every 6 (six) hours as needed for mild pain., Starting Sat 05/22/2016, Print         Stacie Glaze [place], [place] 05/22/16 1610    Noemi Chapel, MD 05/22/16 [phone]

## 2016-05-25 ENCOUNTER — Ambulatory Visit (INDEPENDENT_AMBULATORY_CARE_PROVIDER_SITE_OTHER): Payer: 59 | Admitting: Family Medicine

## 2016-05-25 ENCOUNTER — Encounter: Payer: Self-pay | Admitting: Family Medicine

## 2016-05-25 ENCOUNTER — Encounter: Payer: Self-pay | Admitting: *Deleted

## 2016-05-25 DIAGNOSIS — E119 Type 2 diabetes mellitus without complications: Secondary | ICD-10-CM

## 2016-05-25 DIAGNOSIS — M25552 Pain in left hip: Secondary | ICD-10-CM | POA: Diagnosis not present

## 2016-05-25 DIAGNOSIS — R0789 Other chest pain: Secondary | ICD-10-CM | POA: Diagnosis not present

## 2016-05-25 NOTE — Progress Notes (Signed)
Pre visit review using our clinic review tool, if applicable. No additional management support is needed unless otherwise documented below in the visit note. 

## 2016-05-25 NOTE — Patient Instructions (Addendum)
No clear indication for stress test. Start gentle stretching of left pelvis. Use ibuprofen 4 tabs every 8 hours for pain.

## 2016-05-25 NOTE — Progress Notes (Signed)
Subjective:    Patient ID: Courtney Cox, female    DOB: 1966/04/20, 50 y.o.   MRN: [id number]  HPI  50 year old female with history of DM, poorly controlled, HTN, high chol presents for ER follow up. She has been in ER 2 times in last 2 months.  04/09/2016: CHEST PAIN: neg EKG except sinus tach, neg labs, neg troponin, ddimer  CBG was 206  She has had no further chest pain. She has under a lot of stress lately.  No exertional chest pain.  05/22/2016:  Left abdominal pain, mild to rachypain is located just anterior and medial to her left iliac crest, she denies that this is related to eating, she has no gastrointestinal symptoms, no urinary symptoms. No dysuria.  Pain is worse when she moves a certain way.. No fall no new injury. She did take 2 doses of her husband's Keflex.  Nml BMS, no emesis, no fever  No rash  UA clear in ER. Started on keflex for possible partially treated UTI.  Started on keflex.. Pain is no better.  She has been using ibuprofen 600 mg daily.Marland Kitchen Helps some temporarily.     She has not been eating well lately. Stress and schedule. Eating out a lot. Wt Readings from Last 3 Encounters:  05/25/16 (!) 311 lb 4 oz (141.2 kg)  05/22/16 298 lb (135.2 kg)  04/09/16 298 lb (135.2 kg)    Due DM follow up.  Lab Results  Component Value Date   HGBA1C 7.7 (H) 10/17/2015    She has had a prior hysterectomy, she states she no longer has left  Ovary.  Review of Systems  Constitutional: Negative for fatigue and fever.  HENT: Negative for ear pain.   Eyes: Negative for pain.  Respiratory: Negative for chest tightness and shortness of breath.   Cardiovascular: Negative for chest pain, palpitations and leg swelling.  Gastrointestinal: Negative for abdominal pain.  Genitourinary: Negative for dysuria.       Objective:   Physical Exam  Constitutional: Vital signs are normal. She appears well-developed and well-nourished. She is cooperative.  Non-toxic appearance.  She does not appear ill. No distress.  obese  HENT:  Head: Normocephalic.  Right Ear: Hearing, tympanic membrane, external ear and ear canal normal. Tympanic membrane is not erythematous, not retracted and not bulging.  Left Ear: Hearing, tympanic membrane, external ear and ear canal normal. Tympanic membrane is not erythematous, not retracted and not bulging.  Nose: No mucosal edema or rhinorrhea. Right sinus exhibits no maxillary sinus tenderness and no frontal sinus tenderness. Left sinus exhibits no maxillary sinus tenderness and no frontal sinus tenderness.  Mouth/Throat: Uvula is midline, oropharynx is clear and moist and mucous membranes are normal.  Eyes: Conjunctivae, EOM and lids are normal. Pupils are equal, round, and reactive to light. Lids are everted and swept, no foreign bodies found.  Neck: Trachea normal and normal range of motion. Neck supple. Carotid bruit is not present. No thyroid mass and no thyromegaly present.  Cardiovascular: Normal rate, regular rhythm, S1 normal, S2 normal, normal heart sounds, intact distal pulses and normal pulses.  Exam reveals no gallop and no friction rub.   No murmur heard. Pulmonary/Chest: Effort normal and breath sounds normal. No tachypnea. No respiratory distress. She has no decreased breath sounds. She has no wheezes. She has no rhonchi. She has no rales.  Abdominal: Soft. Normal appearance and bowel sounds are normal. There is no tenderness.  Musculoskeletal:  Left hip: She exhibits tenderness. She exhibits normal range of motion, normal strength and no bony tenderness.       Lumbar back: Normal. She exhibits normal range of motion, no tenderness and no bony tenderness.  ttp over illiac crest and fossa  neg faber  Neurological: She is alert.  Skin: Skin is warm, dry and intact. No rash noted.  Psychiatric: Her speech is normal and behavior is normal. Judgment and thought content normal. Her mood appears not anxious. Cognition and  memory are normal. She does not exhibit a depressed mood.          Assessment & Plan:

## 2016-05-25 NOTE — Assessment & Plan Note (Signed)
Now resolved. Likely due to anxiety and stress.   She is moderate risk with poorly controlled DM.Marland Kitchen So if recurs or any exertional symtpoms.. Consider stress test.

## 2016-05-25 NOTE — Assessment & Plan Note (Addendum)
No associated urinary or GI issues.  Pain likely MSK strain. Treat with NSAIDs and gentle stretching.

## 2016-05-25 NOTE — Assessment & Plan Note (Signed)
Poor  control. Due for re-eval.

## 2016-05-27 ENCOUNTER — Ambulatory Visit: Payer: Self-pay | Admitting: Family Medicine

## 2016-06-03 ENCOUNTER — Telehealth: Payer: Self-pay | Admitting: Family Medicine

## 2016-06-03 DIAGNOSIS — E119 Type 2 diabetes mellitus without complications: Secondary | ICD-10-CM

## 2016-06-03 DIAGNOSIS — E7800 Pure hypercholesterolemia, unspecified: Secondary | ICD-10-CM

## 2016-06-03 NOTE — Telephone Encounter (Signed)
-----   Message from Marchia Bond sent at 05/31/2016  3:33 PM EDT ----- Regarding: Cpx labs Thurs 10/12, need orders. Thanks :-) Please order  future f/u labs for pt's upcoming lab appt. Thanks Aniceto Boss

## 2016-06-08 ENCOUNTER — Other Ambulatory Visit (INDEPENDENT_AMBULATORY_CARE_PROVIDER_SITE_OTHER): Payer: 59

## 2016-06-08 DIAGNOSIS — E119 Type 2 diabetes mellitus without complications: Secondary | ICD-10-CM | POA: Diagnosis not present

## 2016-06-08 DIAGNOSIS — E7800 Pure hypercholesterolemia, unspecified: Secondary | ICD-10-CM

## 2016-06-08 LAB — LIPID PANEL
CHOL/HDL RATIO: 6
Cholesterol: 224 mg/dL — ABNORMAL HIGH (ref 0–200)
HDL: 36 mg/dL — ABNORMAL LOW (ref >39.00)
LDL CALC: 171 mg/dL — AB (ref 0–99)
NonHDL: 187.55
TRIGLYCERIDES: 81 mg/dL (ref 0.0–149.0)
VLDL: 16.2 mg/dL (ref 0.0–40.0)

## 2016-06-08 LAB — COMPREHENSIVE METABOLIC PANEL
ALT: 25 U/L (ref 0–35)
AST: 24 U/L (ref 0–37)
Albumin: 4 g/dL (ref 3.5–5.2)
Alkaline Phosphatase: 50 U/L (ref 39–117)
BILIRUBIN TOTAL: 0.4 mg/dL (ref 0.2–1.2)
BUN: 9 mg/dL (ref 6–23)
CO2: 29 meq/L (ref 19–32)
CREATININE: 0.74 mg/dL (ref 0.40–1.20)
Calcium: 9.4 mg/dL (ref 8.4–10.5)
Chloride: 104 mEq/L (ref 96–112)
GFR: 107.51 mL/min (ref >60.00)
GLUCOSE: 183 mg/dL — AB (ref 70–99)
Potassium: 3.8 mEq/L (ref 3.5–5.1)
SODIUM: 140 meq/L (ref 135–145)
Total Protein: 7.6 g/dL (ref 6.0–8.3)

## 2016-06-08 LAB — HEMOGLOBIN A1C: Hgb A1c MFr Bld: 8.6 % — ABNORMAL HIGH (ref 4.6–6.5)

## 2016-06-11 ENCOUNTER — Ambulatory Visit (INDEPENDENT_AMBULATORY_CARE_PROVIDER_SITE_OTHER): Payer: 59 | Admitting: Family Medicine

## 2016-06-11 ENCOUNTER — Encounter: Payer: Self-pay | Admitting: Family Medicine

## 2016-06-11 VITALS — BP 142/90 | HR 97 | Temp 98.4°F | Ht 65.0 in | Wt 310.0 lb

## 2016-06-11 DIAGNOSIS — E119 Type 2 diabetes mellitus without complications: Secondary | ICD-10-CM

## 2016-06-11 DIAGNOSIS — E7800 Pure hypercholesterolemia, unspecified: Secondary | ICD-10-CM

## 2016-06-11 DIAGNOSIS — I10 Essential (primary) hypertension: Secondary | ICD-10-CM

## 2016-06-11 MED ORDER — LIRAGLUTIDE 18 MG/3ML ~~LOC~~ SOPN: PEN_INJECTOR | SUBCUTANEOUS | 11 refills | Status: DC

## 2016-06-11 MED ORDER — INSULIN PEN NEEDLE 32G X 6 MM MISC: 11 refills | Status: DC

## 2016-06-11 NOTE — Assessment & Plan Note (Signed)
Poor control. Referred to nutritionist.Encouraged exercise, weight loss, healthy eating habits. Start victoza daily. Close follow up in 1 month.  Check FBS and 2 PP.

## 2016-06-11 NOTE — Progress Notes (Signed)
Pre visit review using our clinic review tool, if applicable. No additional management support is needed unless otherwise documented below in the visit note. 

## 2016-06-11 NOTE — Progress Notes (Signed)
50 year old female with history of DM, poorly controlled, HTN, high chol presents for follow up.     Diabetes:   Worsened control of A1C in last 3 months.. On amaryl alone. Metformin did not help.  She has been cooking in last 2 weeks.. No fast food.  trying to limit carbs, she has cut back cookies some. Lab Results  Component Value Date   HGBA1C 8.6 (H) 06/08/2016  Using medications without difficulties: Hypoglycemic episodes:None Hyperglycemic episodes:None Feet problems: Blood Sugars averaging: FBS 169, later in day 198 eye exam within last year: Wt Readings from Last 3 Encounters:  06/11/16 (!) 310 lb (140.6 kg)  05/25/16 (!) 311 lb 4 oz (141.2 kg)  05/22/16 298 lb (135.2 kg)     Elevated Cholesterol:  LDL far from goal <100 on crestor 40 mg.. No SE. Forgets to take it.. And trys to limit as it is expensive. Lab Results  Component Value Date   CHOL 224 (H) 06/08/2016   HDL 36.00 (L) 06/08/2016   LDLCALC 171 (H) 06/08/2016   TRIG 81.0 06/08/2016   CHOLHDL 6 06/08/2016  Using medications without problems: Muscle aches:  Diet compliance: Exercise: Other complaints:  Hypertension:    Inadequate control  Here today on  Losartan HCTZ,  And amlodipine. BP Readings from Last 3 Encounters:  06/11/16 (!) 142/90  05/25/16 140/90  05/22/16 138/86  Using medication without problems or lightheadedness:  None Chest pain with exertion: None Edema none Short of breath:none Average home BPs: 128/80 Other issues:    Review of Systems  Constitutional: Negative for fatigue and fever.  HENT: Negative for ear pain.   Eyes: Negative for pain.  Respiratory: Negative for chest tightness and shortness of breath.   Cardiovascular: Negative for chest pain, palpitations and leg swelling.  Gastrointestinal: Negative for abdominal pain.  Genitourinary: Negative for dysuria.       Objective:   Physical Exam  Constitutional: Vital signs are normal. She appears well-developed and  well-nourished. She is cooperative.  Non-toxic appearance. She does not appear ill. No distress.  obese  HENT:  Head: Normocephalic.  Right Ear: Hearing, tympanic membrane, external ear and ear canal normal. Tympanic membrane is not erythematous, not retracted and not bulging.  Left Ear: Hearing, tympanic membrane, external ear and ear canal normal. Tympanic membrane is not erythematous, not retracted and not bulging.  Nose: No mucosal edema or rhinorrhea. Right sinus exhibits no maxillary sinus tenderness and no frontal sinus tenderness. Left sinus exhibits no maxillary sinus tenderness and no frontal sinus tenderness.  Mouth/Throat: Uvula is midline, oropharynx is clear and moist and mucous membranes are normal.  Eyes: Conjunctivae, EOM and lids are normal. Pupils are equal, round, and reactive to light. Lids are everted and swept, no foreign bodies found.  Neck: Trachea normal and normal range of motion. Neck supple. Carotid bruit is not present. No thyroid mass and no thyromegaly present.  Cardiovascular: Normal rate, regular rhythm, S1 normal, S2 normal, normal heart sounds, intact distal pulses and normal pulses.  Exam reveals no gallop and no friction rub.   No murmur heard. Pulmonary/Chest: Effort normal and breath sounds normal. No tachypnea. No respiratory distress. She has no decreased breath sounds. She has no wheezes. She has no rhonchi. She has no rales.  Abdominal: Soft. Normal appearance and bowel sounds are normal. There is no tenderness.  Musculoskeletal:       Left hip: She exhibits tenderness. She exhibits normal range of motion, normal strength and no  bony tenderness.       Lumbar back: Normal. She exhibits normal range of motion, no tenderness and no bony tenderness.  Neurological: She is alert.  Skin: Skin is warm, dry and intact. No rash noted.  Psychiatric: Her speech is normal and behavior is normal. Judgment and thought content normal. Her mood appears not anxious.  Cognition and memory are normal. She does not exhibit a depressed mood.   Diabetic foot exam: Normal inspection No skin breakdown Several  calluses  Normal DP pulses Normal sensation to light touch and monofilament Nails normal

## 2016-06-11 NOTE — Assessment & Plan Note (Signed)
At goal at home, continue to follow. Encouraged exercise, weight loss, healthy eating habits.

## 2016-06-11 NOTE — Patient Instructions (Addendum)
Work on low fat, low carb diet and weight loss. Start regular exercise. Try to to take crestor more regularly.  Stop at front desk to set up nutrition referral.  Start victoza daily for sugar and weight loss. Continue glimperide.

## 2016-06-11 NOTE — Assessment & Plan Note (Signed)
Not at goal but not taking crestor regularly. Will try to take more frequently.. Offered cheaper med. Pt declined. Discussed low chol die tin detail.

## 2016-06-14 ENCOUNTER — Encounter: Payer: Self-pay | Admitting: Family Medicine

## 2016-06-14 MED ORDER — AMLODIPINE BESYLATE 5 MG PO TABS: 5.0000 mg | ORAL_TABLET | Freq: Every day | ORAL | 3 refills | Status: DC

## 2016-06-14 MED ORDER — GLIMEPIRIDE 4 MG PO TABS: ORAL_TABLET | ORAL | 3 refills | Status: DC

## 2016-06-14 MED ORDER — ROSUVASTATIN CALCIUM 40 MG PO TABS: 40.0000 mg | ORAL_TABLET | Freq: Every day | ORAL | 3 refills | Status: DC

## 2016-06-14 MED ORDER — LOSARTAN POTASSIUM-HCTZ 100-25 MG PO TABS: 1.0000 | ORAL_TABLET | Freq: Every day | ORAL | 3 refills | Status: DC

## 2016-07-23 ENCOUNTER — Encounter: Payer: Self-pay | Admitting: Family Medicine

## 2016-08-03 ENCOUNTER — Ambulatory Visit: Payer: 59 | Admitting: Family Medicine

## 2016-10-22 ENCOUNTER — Ambulatory Visit (INDEPENDENT_AMBULATORY_CARE_PROVIDER_SITE_OTHER): Payer: 59 | Admitting: Family Medicine

## 2016-10-22 ENCOUNTER — Ambulatory Visit: Payer: 59 | Admitting: Family Medicine

## 2016-10-22 ENCOUNTER — Encounter: Payer: Self-pay | Admitting: Family Medicine

## 2016-10-22 VITALS — BP 154/100 | HR 98 | Temp 98.5°F | Ht 65.0 in | Wt 315.0 lb

## 2016-10-22 DIAGNOSIS — J3089 Other allergic rhinitis: Secondary | ICD-10-CM | POA: Diagnosis not present

## 2016-10-22 DIAGNOSIS — R3989 Other symptoms and signs involving the genitourinary system: Secondary | ICD-10-CM | POA: Diagnosis not present

## 2016-10-22 DIAGNOSIS — E119 Type 2 diabetes mellitus without complications: Secondary | ICD-10-CM | POA: Diagnosis not present

## 2016-10-22 DIAGNOSIS — I10 Essential (primary) hypertension: Secondary | ICD-10-CM | POA: Diagnosis not present

## 2016-10-22 DIAGNOSIS — Z23 Encounter for immunization: Secondary | ICD-10-CM | POA: Diagnosis not present

## 2016-10-22 DIAGNOSIS — Z862 Personal history of diseases of the blood and blood-forming organs and certain disorders involving the immune mechanism: Secondary | ICD-10-CM

## 2016-10-22 DIAGNOSIS — E538 Deficiency of other specified B group vitamins: Secondary | ICD-10-CM

## 2016-10-22 DIAGNOSIS — E7800 Pure hypercholesterolemia, unspecified: Secondary | ICD-10-CM

## 2016-10-22 DIAGNOSIS — E559 Vitamin D deficiency, unspecified: Secondary | ICD-10-CM

## 2016-10-22 LAB — POC URINALSYSI DIPSTICK (AUTOMATED)
Bilirubin, UA: NEGATIVE
Glucose, UA: NEGATIVE
KETONES UA: NEGATIVE
Leukocytes, UA: NEGATIVE
Nitrite, UA: NEGATIVE
PH UA: 6
PROTEIN UA: NEGATIVE
RBC UA: NEGATIVE
SPEC GRAV UA: 1.02
Urobilinogen, UA: 0.2

## 2016-10-22 MED ORDER — METFORMIN HCL ER (MOD) 500 MG PO TB24: 1000.0000 mg | ORAL_TABLET | Freq: Every day | ORAL | 3 refills | Status: DC

## 2016-10-22 MED ORDER — EXENATIDE 5 MCG/0.02ML ~~LOC~~ SOPN: 5.0000 ug | PEN_INJECTOR | Freq: Two times a day (BID) | SUBCUTANEOUS | 11 refills | Status: DC

## 2016-10-22 MED ORDER — ROSUVASTATIN CALCIUM 40 MG PO TABS: 40.0000 mg | ORAL_TABLET | Freq: Every day | ORAL | 2 refills | Status: DC

## 2016-10-22 MED ORDER — METFORMIN HCL ER 500 MG PO TB24: 1000.0000 mg | ORAL_TABLET | Freq: Every day | ORAL | 3 refills | Status: DC

## 2016-10-22 MED ORDER — AMLODIPINE BESYLATE 10 MG PO TABS: 10.0000 mg | ORAL_TABLET | Freq: Every day | ORAL | 11 refills | Status: DC

## 2016-10-22 NOTE — Assessment & Plan Note (Signed)
Inadequate control.. Increase amlodipine to 10 mg daily. Follow.

## 2016-10-22 NOTE — Assessment & Plan Note (Signed)
I do not believe we gave metformin a fair trial.. Never went past 500 mg daily. INTry again up at 1000 mg daily, continue amaryl and look into byetta.  Follow up in 2 weeks.

## 2016-10-22 NOTE — Assessment & Plan Note (Signed)
Due for re-eval. 

## 2016-10-22 NOTE — Assessment & Plan Note (Signed)
Ttrial of zyrtec or claritin.. May need allergy eye drops if not improving.

## 2016-10-22 NOTE — Patient Instructions (Addendum)
Start  metformin at 500 mg daily.. If tolerated after 3-4 days increase to 2 tabs daily. Continue amaryl.  Look into Byetta coverage.  Follow blood sugars at home.. Fating and 2 hours after a meal. Bring in measurements.  Keep appt as scheduled.  Increase amlodipine 10 mg daily ( can use up you 5 mg by taking 2 a day).  Follow BP at home and bring to next visit.  Set up yearly eye exam.  Start zyrtec at bedtime or claritin during the day.

## 2016-10-22 NOTE — Progress Notes (Signed)
   Subjective:    Patient ID: Courtney Cox, female    DOB: Jun 22, 1966, 51 y.o.   MRN: [id number]  HPI  51 year old female with history of DM, presents with elevated blood sugars and abnormal urine color.  Her urine has been dark, greenish.. No dysuria.  Has been thirsty and fatigue. DM: on amaryl max, at last OV   Metformin in past did not seem to be effective,  But only went to 500 mg daily.At last OV started trial if victoza:  Unable to try given too expensive.  FBS: 254, 2 hours after meals: not checking Lab Results  Component Value Date   HGBA1C 8.6 (H) 06/08/2016   Hypertension:  Losartan HCTZ, amlodipine. She is under a lot of stress.  BP Readings from Last 3 Encounters:  10/22/16 (!) 154/100  06/11/16 (!) 142/90  05/25/16 140/90  Using medication without problems or lightheadedness:  none Chest pain with exertion: none Edema: none Short of breath: none Average home BPs: Using wrist cuff.. 137/89 Other issues:  She has very watery eyes x 4 months, worse outside, but indoors as well. Not itchy, no redness. Worse since she moved to this area.  No pain.  No sneezing. Has not tried any meds.  Review of Systems  Constitutional: Negative for fatigue and fever.  HENT: Negative for ear pain.   Eyes: Negative for pain.  Respiratory: Negative for chest tightness and shortness of breath.   Cardiovascular: Negative for chest pain, palpitations and leg swelling.  Gastrointestinal: Negative for abdominal pain.  Genitourinary: Negative for dysuria.       Objective:   Physical Exam  Constitutional: Vital signs are normal. She appears well-developed and well-nourished. She is cooperative.  Non-toxic appearance. She does not appear ill. No distress.  Morbid obesity  HENT:  Head: Normocephalic.  Right Ear: Hearing, tympanic membrane, external ear and ear canal normal. Tympanic membrane is not erythematous, not retracted and not bulging.  Left Ear: Hearing, tympanic membrane,  external ear and ear canal normal. Tympanic membrane is not erythematous, not retracted and not bulging.  Nose: No mucosal edema or rhinorrhea. Right sinus exhibits no maxillary sinus tenderness and no frontal sinus tenderness. Left sinus exhibits no maxillary sinus tenderness and no frontal sinus tenderness.  Mouth/Throat: Uvula is midline, oropharynx is clear and moist and mucous membranes are normal.  Eyes: Conjunctivae, EOM and lids are normal. Pupils are equal, round, and reactive to light. Lids are everted and swept, no foreign bodies found.  Neck: Trachea normal and normal range of motion. Neck supple. Carotid bruit is not present. No thyroid mass and no thyromegaly present.  Cardiovascular: Normal rate, regular rhythm, S1 normal, S2 normal, normal heart sounds, intact distal pulses and normal pulses.  Exam reveals no gallop and no friction rub.   No murmur heard. Pulmonary/Chest: Effort normal and breath sounds normal. No tachypnea. No respiratory distress. She has no decreased breath sounds. She has no wheezes. She has no rhonchi. She has no rales.  Abdominal: Soft. Normal appearance and bowel sounds are normal. There is no tenderness.  Neurological: She is alert.  Skin: Skin is warm, dry and intact. No rash noted.  Psychiatric: Her speech is normal and behavior is normal. Judgment and thought content normal. Her mood appears not anxious. Cognition and memory are normal. She does not exhibit a depressed mood.          Assessment & Plan:

## 2016-10-22 NOTE — Progress Notes (Signed)
Pre visit review using our clinic review tool, if applicable. No additional management support is needed unless otherwise documented below in the visit note. 

## 2016-10-22 NOTE — Addendum Note (Signed)
Addended by: Carter Kitten on: 10/22/2016 11:01 AM   Modules accepted: Orders

## 2016-11-04 ENCOUNTER — Other Ambulatory Visit (INDEPENDENT_AMBULATORY_CARE_PROVIDER_SITE_OTHER): Payer: 59

## 2016-11-04 DIAGNOSIS — Z862 Personal history of diseases of the blood and blood-forming organs and certain disorders involving the immune mechanism: Secondary | ICD-10-CM | POA: Diagnosis not present

## 2016-11-04 DIAGNOSIS — E538 Deficiency of other specified B group vitamins: Secondary | ICD-10-CM

## 2016-11-04 DIAGNOSIS — E559 Vitamin D deficiency, unspecified: Secondary | ICD-10-CM

## 2016-11-04 DIAGNOSIS — E119 Type 2 diabetes mellitus without complications: Secondary | ICD-10-CM

## 2016-11-04 LAB — CBC WITH DIFFERENTIAL/PLATELET
BASOS PCT: 0.4 % (ref 0.0–3.0)
Basophils Absolute: 0 10*3/uL (ref 0.0–0.1)
EOS PCT: 1.4 % (ref 0.0–5.0)
Eosinophils Absolute: 0.1 10*3/uL (ref 0.0–0.7)
HEMATOCRIT: 40 % (ref 36.0–46.0)
HEMOGLOBIN: 13 g/dL (ref 12.0–15.0)
Lymphocytes Relative: 32.4 % (ref 12.0–46.0)
Lymphs Abs: 3.4 10*3/uL (ref 0.7–4.0)
MCHC: 32.5 g/dL (ref 30.0–36.0)
MCV: 83.3 fl (ref 78.0–100.0)
MONO ABS: 0.8 10*3/uL (ref 0.1–1.0)
Monocytes Relative: 7.6 % (ref 3.0–12.0)
NEUTROS ABS: 6.1 10*3/uL (ref 1.4–7.7)
Neutrophils Relative %: 58.2 % (ref 43.0–77.0)
Platelets: 391 10*3/uL (ref 150.0–400.0)
RBC: 4.8 Mil/uL (ref 3.87–5.11)
RDW: 15.3 % (ref 11.5–15.5)
WBC: 10.5 10*3/uL (ref 4.0–10.5)

## 2016-11-04 LAB — COMPREHENSIVE METABOLIC PANEL
ALBUMIN: 3.7 g/dL (ref 3.5–5.2)
ALK PHOS: 57 U/L (ref 39–117)
ALT: 20 U/L (ref 0–35)
AST: 13 U/L (ref 0–37)
BILIRUBIN TOTAL: 0.5 mg/dL (ref 0.2–1.2)
BUN: 10 mg/dL (ref 6–23)
CALCIUM: 9.8 mg/dL (ref 8.4–10.5)
CO2: 30 mEq/L (ref 19–32)
Chloride: 102 mEq/L (ref 96–112)
Creatinine, Ser: 0.8 mg/dL (ref 0.40–1.20)
GFR: 98.09 mL/min (ref >60.00)
Glucose, Bld: 190 mg/dL — ABNORMAL HIGH (ref 70–99)
Potassium: 4.4 mEq/L (ref 3.5–5.1)
Sodium: 139 mEq/L (ref 135–145)
TOTAL PROTEIN: 6.8 g/dL (ref 6.0–8.3)

## 2016-11-04 LAB — IBC PANEL
IRON: 83 ug/dL (ref 42–145)
Saturation Ratios: 22.9 % (ref 20.0–50.0)
Transferrin: 259 mg/dL (ref 212.0–360.0)

## 2016-11-04 LAB — LIPID PANEL
Cholesterol: 191 mg/dL (ref 0–200)
HDL: 35.1 mg/dL — AB (ref >39.00)
LDL Cholesterol: 138 mg/dL — ABNORMAL HIGH (ref 0–99)
NonHDL: 155.43
TRIGLYCERIDES: 88 mg/dL (ref 0.0–149.0)
Total CHOL/HDL Ratio: 5
VLDL: 17.6 mg/dL (ref 0.0–40.0)

## 2016-11-04 LAB — VITAMIN B12: VITAMIN B 12: 164 pg/mL — AB (ref 211–911)

## 2016-11-04 LAB — HEMOGLOBIN A1C: HEMOGLOBIN A1C: 9.1 % — AB (ref 4.6–6.5)

## 2016-11-04 LAB — VITAMIN D 25 HYDROXY (VIT D DEFICIENCY, FRACTURES): VITD: 16.64 ng/mL — AB (ref 30.00–100.00)

## 2016-11-04 LAB — FERRITIN: Ferritin: 19.2 ng/mL (ref 10.0–291.0)

## 2016-11-12 ENCOUNTER — Telehealth: Payer: Self-pay | Admitting: Family Medicine

## 2016-11-12 ENCOUNTER — Ambulatory Visit (INDEPENDENT_AMBULATORY_CARE_PROVIDER_SITE_OTHER): Payer: 59 | Admitting: Family Medicine

## 2016-11-12 VITALS — BP 120/86 | HR 95 | Temp 98.5°F | Ht 65.5 in | Wt 307.5 lb

## 2016-11-12 DIAGNOSIS — I10 Essential (primary) hypertension: Secondary | ICD-10-CM | POA: Diagnosis not present

## 2016-11-12 DIAGNOSIS — Z0000 Encounter for general adult medical examination without abnormal findings: Secondary | ICD-10-CM | POA: Diagnosis not present

## 2016-11-12 DIAGNOSIS — E559 Vitamin D deficiency, unspecified: Secondary | ICD-10-CM

## 2016-11-12 DIAGNOSIS — E119 Type 2 diabetes mellitus without complications: Secondary | ICD-10-CM | POA: Diagnosis not present

## 2016-11-12 DIAGNOSIS — Z862 Personal history of diseases of the blood and blood-forming organs and certain disorders involving the immune mechanism: Secondary | ICD-10-CM

## 2016-11-12 DIAGNOSIS — E538 Deficiency of other specified B group vitamins: Secondary | ICD-10-CM | POA: Diagnosis not present

## 2016-11-12 DIAGNOSIS — E7800 Pure hypercholesterolemia, unspecified: Secondary | ICD-10-CM

## 2016-11-12 MED ORDER — CYANOCOBALAMIN 1000 MCG/ML IJ SOLN
1000.0000 ug | Freq: Once | INTRAMUSCULAR | Status: AC
  Administered 2016-11-12: 1000 ug via INTRAMUSCULAR

## 2016-11-12 MED ORDER — VITAMIN D (ERGOCALCIFEROL) 1.25 MG (50000 UNIT) PO CAPS: 50000.0000 [IU] | ORAL_CAPSULE | ORAL | 0 refills | Status: DC

## 2016-11-12 NOTE — Progress Notes (Signed)
Subjective:    Patient ID: Courtney Cox, female    DOB: May 20, 1966, 51 y.o.   MRN: [id number]  HPI   The patient is here for annual wellness exam and preventative care.    Diabetes:    She is current on  500 mg metformin, amaryl tolerating them okay. Lab Results  Component Value Date   HGBA1C 9.1 (H) 11/04/2016  Using medications without difficulties: Hypoglycemic episodes: none Hyperglycemic episodes:occ after cake Feet problems: nonulcer Blood Sugars averaging: FBS 123, 2 hours after meals 178-189. eye exam within last year:  Hypertension:    Good control on losartan HCTZ, and amlodipine (on increased dose 10 mg  BP Readings from Last 3 Encounters:  11/12/16 120/86  10/22/16 (!) 154/100  06/11/16 (!) 142/90  Using medication without problems or lightheadedness:  none Chest pain with exertion: None Edema:none Short of breath: none Average home BPs:120/70s Other issues:   Wt Readings from Last 3 Encounters:  11/12/16 (!) 307 lb 8 oz (139.5 kg)  10/22/16 (!) 315 lb (142.9 kg)  06/11/16 (!) 310 lb (140.6 kg)   Elevated Cholesterol:  Inadequate control using crestor.. More compliant with taking it. Lab Results  Component Value Date   CHOL 191 11/04/2016   HDL 35.10 (L) 11/04/2016   LDLCALC 138 (H) 11/04/2016   TRIG 88.0 11/04/2016   CHOLHDL 5 11/04/2016  Using medications without problems: Muscle aches:  Diet compliance:  Some improvement Exercise: none Other complaints:  Vit D def: on no supplement.  Vit B12 low: need to replete  Social History /Family History/Past Medical History reviewed and updated if needed. Blood pressure 120/86, pulse 95, temperature 98.5 F (36.9 C), temperature source Oral, height 5' 5.5" (1.664 m), weight (!) 307 lb 8 oz (139.5 kg), last menstrual period 02/07/2012.  Review of Systems  Constitutional: Positive for fatigue. Negative for fever.       She is feeling better overall  HENT: Negative for congestion.   Eyes: Negative  for pain.  Respiratory: Negative for cough and shortness of breath.   Cardiovascular: Negative for chest pain, palpitations and leg swelling.  Gastrointestinal: Negative for abdominal pain.  Genitourinary: Negative for dysuria and vaginal bleeding.  Musculoskeletal: Negative for back pain.  Neurological: Negative for syncope, light-headedness and headaches.  Psychiatric/Behavioral: Negative for dysphoric mood.       Objective:   Physical Exam  Constitutional: Vital signs are normal. She appears well-developed and well-nourished. She is cooperative.  Non-toxic appearance. She does not appear ill. No distress.  Morbid obesity  HENT:  Head: Normocephalic.  Right Ear: Hearing, tympanic membrane, external ear and ear canal normal.  Left Ear: Hearing, tympanic membrane, external ear and ear canal normal.  Nose: Nose normal.  Eyes: Conjunctivae, EOM and lids are normal. Pupils are equal, round, and reactive to light. Lids are everted and swept, no foreign bodies found.  Neck: Trachea normal and normal range of motion. Neck supple. Carotid bruit is not present. No thyroid mass and no thyromegaly present.  Cardiovascular: Normal rate, regular rhythm, S1 normal, S2 normal, normal heart sounds and intact distal pulses.  Exam reveals no gallop.   No murmur heard. Pulmonary/Chest: Effort normal and breath sounds normal. No respiratory distress. She has no wheezes. She has no rhonchi. She has no rales.  Abdominal: Soft. Normal appearance and bowel sounds are normal. She exhibits no distension, no fluid wave, no abdominal bruit and no mass. There is no hepatosplenomegaly. There is no tenderness. There is no rebound,  no guarding and no CVA tenderness. No hernia.  Lymphadenopathy:    She has no cervical adenopathy.    She has no axillary adenopathy.  Neurological: She is alert. She has normal strength. No cranial nerve deficit or sensory deficit.  Skin: Skin is warm, dry and intact. No rash noted.    Psychiatric: Her speech is normal and behavior is normal. Judgment normal. Her mood appears not anxious. Cognition and memory are normal. She does not exhibit a depressed mood.   Diabetic foot exam: Normal inspection No skin breakdown No calluses  Normal DP pulses Normal sensation to light touch and monofilament Nails normal        Assessment & Plan:  The patient's preventative maintenance and recommended screening tests for an annual wellness exam were reviewed in full today. Brought up to date unless services declined.  Counselled on the importance of diet, exercise, and its role in overall health and mortality. The patient's FH and SH was reviewed, including their home life, tobacco status, and drug and alcohol status.   Vaccines; uptodate, refused flu PAP/DVE:  nml pap 2016, HPV, next pap due in 5 year, no indication for  DVE. Mammo: Due.  HIV screen: refused. No family history with colon cancer.  nonsmoker  ETOH: rare wine

## 2016-11-12 NOTE — Assessment & Plan Note (Signed)
INadequate control.. Make sure taking crestor daily. Re-eval in 3 months

## 2016-11-12 NOTE — Telephone Encounter (Signed)
Error

## 2016-11-12 NOTE — Addendum Note (Signed)
Addended by: Carter Kitten on: 11/12/2016 09:35 AM   Modules accepted: Orders

## 2016-11-12 NOTE — Assessment & Plan Note (Signed)
Improved control on higher dose amlodipine and with losartan HCTZ. Encouraged exercise, weight loss, healthy eating habits.

## 2016-11-12 NOTE — Progress Notes (Signed)
Pre visit review using our clinic review tool, if applicable. No additional management support is needed unless otherwise documented below in the visit note. 

## 2016-11-12 NOTE — Assessment & Plan Note (Signed)
Resolved

## 2016-11-12 NOTE — Assessment & Plan Note (Signed)
Improved control with metformin.. Likely will need to increase to 1000 mg daily but pt hesitant. States FBS in last few days 120s at home. Follow  Up in 3 months.

## 2016-11-12 NOTE — Assessment & Plan Note (Signed)
Replete

## 2016-11-12 NOTE — Patient Instructions (Addendum)
Keep up with weight loss, decrease carbohydrates in diet,  add regular exercise to routine.  Call in next few weeks with morning blood sugars if > 120.Marland Kitchen So that we can increase metformin.  Start vit D supplement x 12 weeks.. Once completed start daily OTC vit 400 IU daily.  Will given B12 injection today, then start vit b12 1000 mcg daily.  Make sure taking crestor.  Call  to set up mammogram on your own.

## 2017-01-28 ENCOUNTER — Other Ambulatory Visit: Payer: Self-pay | Admitting: *Deleted

## 2017-01-28 MED ORDER — METFORMIN HCL ER 500 MG PO TB24: 1000.0000 mg | ORAL_TABLET | Freq: Every day | ORAL | 0 refills | Status: DC

## 2017-02-13 ENCOUNTER — Other Ambulatory Visit: Payer: Self-pay | Admitting: Family Medicine

## 2017-02-14 NOTE — Telephone Encounter (Signed)
Last refill 11/12/16 Last office visit 11/12/16 Please see office note dated 11/12/16 and confirm no refill needed

## 2017-03-22 ENCOUNTER — Ambulatory Visit (INDEPENDENT_AMBULATORY_CARE_PROVIDER_SITE_OTHER): Payer: 59 | Admitting: Family Medicine

## 2017-03-22 ENCOUNTER — Other Ambulatory Visit: Payer: Self-pay | Admitting: Family Medicine

## 2017-03-22 ENCOUNTER — Encounter: Payer: Self-pay | Admitting: Family Medicine

## 2017-03-22 VITALS — BP 130/88 | HR 122 | Temp 99.2°F | Ht 65.5 in | Wt 311.5 lb

## 2017-03-22 DIAGNOSIS — E860 Dehydration: Secondary | ICD-10-CM | POA: Insufficient documentation

## 2017-03-22 DIAGNOSIS — J029 Acute pharyngitis, unspecified: Secondary | ICD-10-CM

## 2017-03-22 DIAGNOSIS — J020 Streptococcal pharyngitis: Secondary | ICD-10-CM | POA: Insufficient documentation

## 2017-03-22 LAB — POCT RAPID STREP A (OFFICE): RAPID STREP A SCREEN: POSITIVE — AB

## 2017-03-22 MED ORDER — METFORMIN HCL ER 500 MG PO TB24: 1000.0000 mg | ORAL_TABLET | Freq: Every day | ORAL | 0 refills | Status: DC

## 2017-03-22 MED ORDER — AMOXICILLIN 500 MG PO CAPS: 1000.0000 mg | ORAL_CAPSULE | Freq: Two times a day (BID) | ORAL | 0 refills | Status: DC

## 2017-03-22 NOTE — Assessment & Plan Note (Signed)
Complete antibiotics, pain control. Push fluids in light of mild dehydration.

## 2017-03-22 NOTE — Patient Instructions (Addendum)
Ibuprofen 800 mg every 8 hours as needed for pain in throat and fever.  Complete 10 days of antibiotics. Push fluids as much as able to avoid further dehydration.  Strep Throat Strep throat is a bacterial infection of the throat. Your health care provider may call the infection tonsillitis or pharyngitis, depending on whether there is swelling in the tonsils or at the back of the throat. Strep throat is most common during the cold months of the year in children who are 55-67 years of age, but it can happen during any season in people of any age. This infection is spread from person to person (contagious) through coughing, sneezing, or close contact. What are the causes? Strep throat is caused by the bacteria called Streptococcus pyogenes. What increases the risk? This condition is more likely to develop in:  People who spend time in crowded places where the infection can spread easily.  People who have close contact with someone who has strep throat.  What are the signs or symptoms? Symptoms of this condition include:  Fever or chills.  Redness, swelling, or pain in the tonsils or throat.  Pain or difficulty when swallowing.  White or yellow spots on the tonsils or throat.  Swollen, tender glands in the neck or under the jaw.  Red rash all over the body (rare).  How is this diagnosed? This condition is diagnosed by performing a rapid strep test or by taking a swab of your throat (throat culture test). Results from a rapid strep test are usually ready in a few minutes, but throat culture test results are available after one or two days. How is this treated? This condition is treated with antibiotic medicine. Follow these instructions at home: Medicines  Take over-the-counter and prescription medicines only as told by your health care provider.  Take your antibiotic as told by your health care provider. Do not stop taking the antibiotic even if you start to feel better.  Have  family members who also have a sore throat or fever tested for strep throat. They may need antibiotics if they have the strep infection. Eating and drinking  Do not share food, drinking cups, or personal items that could cause the infection to spread to other people.  If swallowing is difficult, try eating soft foods until your sore throat feels better.  Drink enough fluid to keep your urine clear or pale yellow. General instructions  Gargle with a salt-water mixture 3-4 times per day or as needed. To make a salt-water mixture, completely dissolve -1 tsp of salt in 1 cup of warm water.  Make sure that all household members wash their hands well.  Get plenty of rest.  Stay home from school or work until you have been taking antibiotics for 24 hours.  Keep all follow-up visits as told by your health care provider. This is important. Contact a health care provider if:  The glands in your neck continue to get bigger.  You develop a rash, cough, or earache.  You cough up a thick liquid that is green, yellow-brown, or bloody.  You have pain or discomfort that does not get better with medicine.  Your problems seem to be getting worse rather than better.  You have a fever. Get help right away if:  You have new symptoms, such as vomiting, severe headache, stiff or painful neck, chest pain, or shortness of breath.  You have severe throat pain, drooling, or changes in your voice.  You have swelling of the  neck, or the skin on the neck becomes red and tender.  You have signs of dehydration, such as fatigue, dry mouth, and decreased urination.  You become increasingly sleepy, or you cannot wake up completely.  Your joints become red or painful. This information is not intended to replace advice given to you by your health care provider. Make sure you discuss any questions you have with your health care provider. Document Released: 08/06/2000 Document Revised: 04/07/2016 Document  Reviewed: 12/02/2014 Elsevier Interactive Patient Education  2017 [employer].

## 2017-03-22 NOTE — Assessment & Plan Note (Signed)
Push fluids

## 2017-03-22 NOTE — Progress Notes (Signed)
   Subjective:    Patient ID: Courtney Cox, female    DOB: 1965-12-14, 51 y.o.   MRN: [id number]  Sore Throat   This is a new problem. The problem has been gradually worsening. Neither side of throat is experiencing more pain than the other. Maximum temperature: no measured fever. Associated symptoms include headaches, swollen glands and trouble swallowing. Pertinent negatives include no congestion, coughing, ear pain, plugged ear sensation or shortness of breath. Associated symptoms comments:  Fatigue, Chills and fever subjective. She has had exposure to strep. She has tried gargles for the symptoms. The treatment provided mild relief.      Review of Systems  HENT: Positive for trouble swallowing. Negative for congestion and ear pain.   Respiratory: Negative for cough and shortness of breath.   Neurological: Positive for headaches.       Objective:   Physical Exam  Constitutional: Vital signs are normal. She appears well-developed and well-nourished. She is cooperative.  Non-toxic appearance. She does not appear ill. No distress.  HENT:  Head: Normocephalic.  Right Ear: Hearing, tympanic membrane, external ear and ear canal normal. Tympanic membrane is not erythematous, not retracted and not bulging.  Left Ear: Hearing, tympanic membrane, external ear and ear canal normal. Tympanic membrane is not erythematous, not retracted and not bulging.  Nose: Mucosal edema and rhinorrhea present. Right sinus exhibits no maxillary sinus tenderness and no frontal sinus tenderness. Left sinus exhibits no maxillary sinus tenderness and no frontal sinus tenderness.  Mouth/Throat: Uvula is midline and mucous membranes are normal. Posterior oropharyngeal edema and posterior oropharyngeal erythema present. No oropharyngeal exudate or tonsillar abscesses.  Eyes: Pupils are equal, round, and reactive to light. Conjunctivae, EOM and lids are normal. Lids are everted and swept, no foreign bodies found.  Neck:  Trachea normal and normal range of motion. Neck supple. Carotid bruit is not present. No thyroid mass and no thyromegaly present.  Cardiovascular: Regular rhythm, S1 normal, S2 normal, normal heart sounds, intact distal pulses and normal pulses.  Tachycardia present.  Exam reveals no gallop and no friction rub.   No murmur heard. Pulmonary/Chest: Effort normal and breath sounds normal. No tachypnea. No respiratory distress. She has no decreased breath sounds. She has no wheezes. She has no rhonchi. She has no rales.  Lymphadenopathy:    She has cervical adenopathy.       Right cervical: Superficial cervical adenopathy present.       Left cervical: Superficial cervical adenopathy present.  Neurological: She is alert.  Skin: Skin is warm, dry and intact. No rash noted.  Psychiatric: Her speech is normal and behavior is normal. Judgment normal. Her mood appears not anxious. Cognition and memory are normal. She does not exhibit a depressed mood.          Assessment & Plan:

## 2017-03-25 ENCOUNTER — Ambulatory Visit: Payer: Self-pay | Admitting: Family Medicine

## 2017-04-22 ENCOUNTER — Encounter: Payer: Self-pay | Admitting: Family Medicine

## 2017-04-22 ENCOUNTER — Ambulatory Visit (INDEPENDENT_AMBULATORY_CARE_PROVIDER_SITE_OTHER): Payer: 59 | Admitting: Family Medicine

## 2017-04-22 VITALS — BP 122/92 | HR 119 | Temp 98.4°F | Ht 65.5 in | Wt 304.5 lb

## 2017-04-22 DIAGNOSIS — R1013 Epigastric pain: Secondary | ICD-10-CM | POA: Insufficient documentation

## 2017-04-22 DIAGNOSIS — R000 Tachycardia, unspecified: Secondary | ICD-10-CM | POA: Diagnosis not present

## 2017-04-22 LAB — CBC WITH DIFFERENTIAL/PLATELET
Basophils Absolute: 0 10*3/uL (ref 0.0–0.1)
Basophils Relative: 0.2 % (ref 0.0–3.0)
EOS PCT: 0.8 % (ref 0.0–5.0)
Eosinophils Absolute: 0.1 10*3/uL (ref 0.0–0.7)
HCT: 46.8 % — ABNORMAL HIGH (ref 36.0–46.0)
Hemoglobin: 15.2 g/dL — ABNORMAL HIGH (ref 12.0–15.0)
LYMPHS ABS: 3.1 10*3/uL (ref 0.7–4.0)
Lymphocytes Relative: 28.1 % (ref 12.0–46.0)
MCHC: 32.5 g/dL (ref 30.0–36.0)
MCV: 84.3 fl (ref 78.0–100.0)
MONO ABS: 0.7 10*3/uL (ref 0.1–1.0)
MONOS PCT: 6.5 % (ref 3.0–12.0)
NEUTROS ABS: 7.2 10*3/uL (ref 1.4–7.7)
NEUTROS PCT: 64.4 % (ref 43.0–77.0)
PLATELETS: 435 10*3/uL — AB (ref 150.0–400.0)
RBC: 5.55 Mil/uL — ABNORMAL HIGH (ref 3.87–5.11)
RDW: 14.7 % (ref 11.5–15.5)
WBC: 11.2 10*3/uL — ABNORMAL HIGH (ref 4.0–10.5)

## 2017-04-22 LAB — COMPREHENSIVE METABOLIC PANEL
ALK PHOS: 72 U/L (ref 39–117)
ALT: 31 U/L (ref 0–35)
AST: 20 U/L (ref 0–37)
Albumin: 4.4 g/dL (ref 3.5–5.2)
BUN: 8 mg/dL (ref 6–23)
CHLORIDE: 96 meq/L (ref 96–112)
CO2: 29 meq/L (ref 19–32)
Calcium: 10.3 mg/dL (ref 8.4–10.5)
Creatinine, Ser: 0.86 mg/dL (ref 0.40–1.20)
GFR: 90.06 mL/min (ref >60.00)
GLUCOSE: 225 mg/dL — AB (ref 70–99)
POTASSIUM: 4.2 meq/L (ref 3.5–5.1)
SODIUM: 133 meq/L — AB (ref 135–145)
TOTAL PROTEIN: 7.8 g/dL (ref 6.0–8.3)
Total Bilirubin: 0.5 mg/dL (ref 0.2–1.2)

## 2017-04-22 LAB — LIPASE: LIPASE: 13 U/L (ref 11.0–59.0)

## 2017-04-22 NOTE — Assessment & Plan Note (Signed)
?   Secondary to dehydration vs new anemia.  Eval with labs.  Push fluids

## 2017-04-22 NOTE — Assessment & Plan Note (Signed)
Most liekyl consistent with Viral GE.Marland Kitchen Now resolving.  ? Melena, now resolved.  Can start ppi.  Eval with labs.  Push fluids.

## 2017-04-22 NOTE — Patient Instructions (Addendum)
Push fluids, bland diet  Please stop at the lab to have labs drawn.  Can consider starting prilosec 20 mg daily for stomach protection. Rest.  Go to ER if blood in stool.   Call if not continuing to improve.

## 2017-04-22 NOTE — Progress Notes (Signed)
   Subjective:    Patient ID: Courtney Cox, female    DOB: 1966-05-25, 52 y.o.   MRN: [id number]  HPI    51 year old female with diabetes mellitus presents with new onset 2 days ago started having epigastric pain ( 7/10).. Had dark diarrhea ( dark brown)  Abdominal cramping, hot flashes, headache.  Subjective fever, did not measure.  Very tired. She thinks day before she has chicken legs.  No heart burn , no acid taste in mouth.  She tried eating probiotic yogurt, ginger ale, bland foods. She has had about 32 ox in last 24 hours.. No emesis.   Stool has now changed to nml BM color, now pain 3/10  Diarrhea has resolved.   No history of GI illness.  No new meds. Did take a little ibuprofen 1 month ago with strep.  Recent strep throat 03/22/2017  Hx of iron def anemia Blood pressure (!) 122/92, pulse (!) 119, temperature 98.4 F (36.9 C), temperature source Oral, height 5' 5.5" (1.664 m), weight (!) 304 lb 8 oz (138.1 kg), last menstrual period 02/07/2012.  Review of Systems  Constitutional: Negative for fatigue and fever.  HENT: Negative for ear pain.   Eyes: Negative for pain.  Respiratory: Negative for chest tightness and shortness of breath.   Cardiovascular: Negative for chest pain, palpitations and leg swelling.  Gastrointestinal: Positive for abdominal pain.  Genitourinary: Negative for dysuria.       Objective:   Physical Exam  Constitutional: Vital signs are normal. She appears well-developed and well-nourished. She is cooperative.  Non-toxic appearance. She does not appear ill. No distress.  HENT:  Head: Normocephalic.  Right Ear: Hearing, tympanic membrane, external ear and ear canal normal. Tympanic membrane is not erythematous, not retracted and not bulging.  Left Ear: Hearing, tympanic membrane, external ear and ear canal normal. Tympanic membrane is not erythematous, not retracted and not bulging.  Nose: No mucosal edema or rhinorrhea. Right sinus exhibits  no maxillary sinus tenderness and no frontal sinus tenderness. Left sinus exhibits no maxillary sinus tenderness and no frontal sinus tenderness.  Mouth/Throat: Uvula is midline, oropharynx is clear and moist and mucous membranes are normal.  Eyes: Pupils are equal, round, and reactive to light. Conjunctivae, EOM and lids are normal. Lids are everted and swept, no foreign bodies found.  Neck: Trachea normal and normal range of motion. Neck supple. Carotid bruit is not present. No thyroid mass and no thyromegaly present.  Cardiovascular: Normal rate, regular rhythm, S1 normal, S2 normal, normal heart sounds, intact distal pulses and normal pulses.  Exam reveals no gallop and no friction rub.   No murmur heard. Pulmonary/Chest: Effort normal and breath sounds normal. No tachypnea. No respiratory distress. She has no decreased breath sounds. She has no wheezes. She has no rhonchi. She has no rales.  Abdominal: Soft. Normal appearance and bowel sounds are normal. There is no tenderness.  Neurological: She is alert.  Skin: Skin is warm, dry and intact. No rash noted.  Psychiatric: Her speech is normal and behavior is normal. Judgment and thought content normal. Her mood appears not anxious. Cognition and memory are normal. She does not exhibit a depressed mood.          Assessment & Plan:

## 2017-05-09 ENCOUNTER — Telehealth: Payer: Self-pay | Admitting: Family Medicine

## 2017-05-09 NOTE — Telephone Encounter (Signed)
Pt sent my chart wanting to schedule lab appointment   No labs in system  Is it ok to schedule??  See below message Appointment Request From: Raena Pau. Amedeo Plenty    With Provider: Eliezer Lofts, MD [facility] [place]    Preferred Date Range: From 05/12/2017 To 05/18/2017    Preferred Times: Any    Reason: To address the following health maintenance concerns.  Ophthalmology Exam  Hemoglobin A1c    Comments:

## 2017-05-10 NOTE — Telephone Encounter (Signed)
Okay to schedule lab appt.. I will add labs after appt made.

## 2017-05-10 NOTE — Telephone Encounter (Signed)
Courtney Cox made appointment 9/20

## 2017-05-13 ENCOUNTER — Ambulatory Visit (INDEPENDENT_AMBULATORY_CARE_PROVIDER_SITE_OTHER): Payer: 59 | Admitting: Family Medicine

## 2017-05-13 ENCOUNTER — Encounter: Payer: Self-pay | Admitting: Family Medicine

## 2017-05-13 VITALS — BP 158/110 | HR 100 | Temp 97.4°F | Ht 64.0 in | Wt 309.0 lb

## 2017-05-13 DIAGNOSIS — E7800 Pure hypercholesterolemia, unspecified: Secondary | ICD-10-CM

## 2017-05-13 DIAGNOSIS — E538 Deficiency of other specified B group vitamins: Secondary | ICD-10-CM | POA: Diagnosis not present

## 2017-05-13 DIAGNOSIS — E119 Type 2 diabetes mellitus without complications: Secondary | ICD-10-CM | POA: Diagnosis not present

## 2017-05-13 DIAGNOSIS — I10 Essential (primary) hypertension: Secondary | ICD-10-CM

## 2017-05-13 LAB — HM DIABETES FOOT EXAM

## 2017-05-13 MED ORDER — ROSUVASTATIN CALCIUM 40 MG PO TABS: 40.0000 mg | ORAL_TABLET | Freq: Every day | ORAL | 3 refills | Status: DC

## 2017-05-13 MED ORDER — GLIMEPIRIDE 4 MG PO TABS: ORAL_TABLET | ORAL | 3 refills | Status: DC

## 2017-05-13 MED ORDER — METFORMIN HCL ER 500 MG PO TB24: 1500.0000 mg | ORAL_TABLET | Freq: Every day | ORAL | 3 refills | Status: DC

## 2017-05-13 MED ORDER — CYANOCOBALAMIN 1000 MCG/ML IJ SOLN
1000.0000 ug | Freq: Once | INTRAMUSCULAR | Status: AC
  Administered 2017-05-13: 1000 ug via INTRAMUSCULAR

## 2017-05-13 MED ORDER — AMLODIPINE BESYLATE 10 MG PO TABS: 10.0000 mg | ORAL_TABLET | Freq: Every day | ORAL | 3 refills | Status: DC

## 2017-05-13 MED ORDER — LOSARTAN POTASSIUM-HCTZ 100-25 MG PO TABS: 1.0000 | ORAL_TABLET | Freq: Every day | ORAL | 3 refills | Status: DC

## 2017-05-13 NOTE — Addendum Note (Signed)
Addended by: Amado Coe on: 05/13/2017 03:17 PM   Modules accepted: Orders

## 2017-05-13 NOTE — Assessment & Plan Note (Signed)
Check a1C. Inadequate control... likely will increase metformin to 1500 mg daily.

## 2017-05-13 NOTE — Assessment & Plan Note (Signed)
Did not take meds today.. Is fasting.. Will take ASAP.

## 2017-05-13 NOTE — Assessment & Plan Note (Signed)
Replete

## 2017-05-13 NOTE — Progress Notes (Signed)
   Subjective:    Patient ID: Courtney Cox, female    DOB: 12/21/1965, 51 y.o.   MRN: [id number]  HPI    51 year old female presents for follow up DM.  Diabetes:   Due for re-eval. On metformin 1000 mg daily and  2 tabs glimperide Lab Results  Component Value Date   HGBA1C 9.1 (H) 11/04/2016  Using medications without difficulties: no SE  Hypoglycemic episodes:none Hyperglycemic episodes:none Feet problems: no ulcer Blood Sugars averaging: FBS 240, has not been checking later in day eye exam within last year: upcoming  Exercise: none  Diet:  Low carb Wt Readings from Last 3 Encounters:  05/13/17 (!) 309 lb (140.2 kg)  04/22/17 (!) 304 lb 8 oz (138.1 kg)  03/22/17 (!) 311 lb 8 oz (141.3 kg)     Hypertension:  Very elevated today// but she did not take the  amlodipine max, losartan HCTZ max this AM. Using medication without problems or lightheadedness:  Chest pain with exertion:none Edema:none Short of breath: Average home BPs: 133/84 -142/94 Other issues:  Blood pressure (!) 158/110, pulse 100, temperature (!) 97.4 F (36.3 C), temperature source Oral, height 5\' 4"  (1.626 m), weight (!) 309 lb (140.2 kg), last menstrual period 02/07/2012, SpO2 96 %.     Review of Systems  Constitutional: Negative for fatigue and fever.  HENT: Negative for ear pain.   Eyes: Negative for pain.  Respiratory: Negative for chest tightness and shortness of breath.   Cardiovascular: Negative for chest pain, palpitations and leg swelling.  Gastrointestinal: Negative for abdominal pain.  Genitourinary: Negative for dysuria.       Objective:   Physical Exam  Constitutional: Vital signs are normal. She appears well-developed and well-nourished. She is cooperative.  Non-toxic appearance. She does not appear ill. No distress.  obese  HENT:  Head: Normocephalic.  Right Ear: Hearing, tympanic membrane, external ear and ear canal normal. Tympanic membrane is not erythematous, not retracted  and not bulging.  Left Ear: Hearing, tympanic membrane, external ear and ear canal normal. Tympanic membrane is not erythematous, not retracted and not bulging.  Nose: No mucosal edema or rhinorrhea. Right sinus exhibits no maxillary sinus tenderness and no frontal sinus tenderness. Left sinus exhibits no maxillary sinus tenderness and no frontal sinus tenderness.  Mouth/Throat: Uvula is midline, oropharynx is clear and moist and mucous membranes are normal.  Eyes: Pupils are equal, round, and reactive to light. Conjunctivae, EOM and lids are normal. Lids are everted and swept, no foreign bodies found.  Neck: Trachea normal and normal range of motion. Neck supple. Carotid bruit is not present. No thyroid mass and no thyromegaly present.  Cardiovascular: Normal rate, regular rhythm, S1 normal, S2 normal, normal heart sounds, intact distal pulses and normal pulses.  Exam reveals no gallop and no friction rub.   No murmur heard. Pulmonary/Chest: Effort normal and breath sounds normal. No tachypnea. No respiratory distress. She has no decreased breath sounds. She has no wheezes. She has no rhonchi. She has no rales.  Abdominal: Soft. Normal appearance and bowel sounds are normal. There is no tenderness.  Neurological: She is alert.  Skin: Skin is warm, dry and intact. No rash noted.  Psychiatric: Her speech is normal and behavior is normal. Judgment and thought content normal. Her mood appears not anxious. Cognition and memory are normal. She does not exhibit a depressed mood.          Assessment & Plan:

## 2017-05-13 NOTE — Patient Instructions (Signed)
Please stop at the lab to have labs drawn.  Plan on if A1C > 7.. To increase metformin to 3 tabs daily.

## 2017-05-13 NOTE — Assessment & Plan Note (Signed)
Due for re-eval. 

## 2017-05-14 LAB — HEMOGLOBIN A1C
Hgb A1c MFr Bld: 10.2 % of total Hgb — ABNORMAL HIGH (ref <5.7)
Mean Plasma Glucose: 246 (calc)
eAG (mmol/L): 13.6 (calc)

## 2017-05-14 LAB — COMPREHENSIVE METABOLIC PANEL
AG Ratio: 1.2 (calc) (ref 1.0–2.5)
ALBUMIN MSPROF: 4 g/dL (ref 3.6–5.1)
ALT: 28 U/L (ref 6–29)
AST: 17 U/L (ref 10–35)
Alkaline phosphatase (APISO): 61 U/L (ref 33–115)
BUN: 9 mg/dL (ref 7–25)
CHLORIDE: 101 mmol/L (ref 98–110)
CO2: 27 mmol/L (ref 20–32)
CREATININE: 0.85 mg/dL (ref 0.50–1.10)
Calcium: 9.3 mg/dL (ref 8.6–10.2)
GLOBULIN: 3.3 g/dL (ref 1.9–3.7)
GLUCOSE: 166 mg/dL — AB (ref 65–99)
POTASSIUM: 4 mmol/L (ref 3.5–5.3)
SODIUM: 138 mmol/L (ref 135–146)
Total Bilirubin: 0.6 mg/dL (ref 0.2–1.2)
Total Protein: 7.3 g/dL (ref 6.1–8.1)

## 2017-05-14 LAB — LIPID PANEL
Cholesterol: 199 mg/dL (ref <200)
HDL: 38 mg/dL — ABNORMAL LOW (ref >50)
LDL Cholesterol (Calc): 142 mg/dL (calc) — ABNORMAL HIGH
NON-HDL CHOLESTEROL (CALC): 161 mg/dL — AB (ref <130)
TRIGLYCERIDES: 86 mg/dL (ref <150)
Total CHOL/HDL Ratio: 5.2 (calc) — ABNORMAL HIGH (ref <5.0)

## 2017-05-16 LAB — HM DIABETES EYE EXAM

## 2017-05-24 ENCOUNTER — Encounter: Payer: Self-pay | Admitting: Family Medicine

## 2017-05-30 ENCOUNTER — Encounter: Payer: Self-pay | Admitting: Family Medicine

## 2017-09-07 ENCOUNTER — Telehealth: Payer: Self-pay | Admitting: Family Medicine

## 2017-09-07 NOTE — Telephone Encounter (Signed)
Informed pt that she needed to contact [place] to retrieve medications originally sent to [facility]. Original prescriptions were written for 90 tabs with 3 refills. Advised pt to call office back if [facility] was unable to get the medications. Pt verbalized understanding.

## 2017-09-07 NOTE — Telephone Encounter (Signed)
Copied from [place] [id number]. Topic: Quick Communication - See Telephone Encounter >> Sep 07, 2017 11:52 AM Bea Graff, NT wrote: CRM for notification. See Telephone encounter for: Pt needs rx refill for Metformin, Glimepiride, Crestor, and amlodipine sent to [facility] on [address]  09/07/17.

## 2017-09-19 ENCOUNTER — Telehealth: Payer: Self-pay | Admitting: *Deleted

## 2017-09-19 NOTE — Telephone Encounter (Signed)
Received fax from [facility] stating Losartan 100 mg and Losartan/HCTZ 100-25 mg are on back order.  Please Advise.

## 2017-09-20 MED ORDER — LOSARTAN POTASSIUM-HCTZ 100-25 MG PO TABS: 1.0000 | ORAL_TABLET | Freq: Every day | ORAL | 3 refills | Status: DC

## 2017-09-20 NOTE — Telephone Encounter (Signed)
Spoke with Pharmacist at [place] and they have Losartan-HCTZ 100-25 mg in stock.  [facility] [address].  Courtney Cox notified telephone.  [place] notified that prescription has been sent to different pharmacy that has it in [place].

## 2017-09-20 NOTE — Telephone Encounter (Signed)
Can she get elsewhere?  Forward the rx?

## 2018-01-26 ENCOUNTER — Encounter (HOSPITAL_COMMUNITY): Payer: Self-pay | Admitting: Emergency Medicine

## 2018-01-26 ENCOUNTER — Other Ambulatory Visit: Payer: Self-pay

## 2018-01-26 ENCOUNTER — Emergency Department (HOSPITAL_COMMUNITY): Payer: Self-pay

## 2018-01-26 ENCOUNTER — Emergency Department (HOSPITAL_COMMUNITY)
Admission: EM | Admit: 2018-01-26 | Discharge: 2018-01-26 | Disposition: A | Discharge status: Home / Self Care | Payer: Self-pay | Attending: Emergency Medicine | Admitting: Emergency Medicine

## 2018-01-26 DIAGNOSIS — R0789 Other chest pain: Secondary | ICD-10-CM | POA: Insufficient documentation

## 2018-01-26 DIAGNOSIS — Z79899 Other long term (current) drug therapy: Secondary | ICD-10-CM | POA: Insufficient documentation

## 2018-01-26 DIAGNOSIS — I10 Essential (primary) hypertension: Secondary | ICD-10-CM | POA: Insufficient documentation

## 2018-01-26 LAB — BASIC METABOLIC PANEL
Anion gap: 10 (ref 5–15)
BUN: 7 mg/dL (ref 6–20)
CALCIUM: 9.5 mg/dL (ref 8.9–10.3)
CHLORIDE: 102 mmol/L (ref 101–111)
CO2: 26 mmol/L (ref 22–32)
CREATININE: 0.69 mg/dL (ref 0.44–1.00)
GFR calc non Af Amer: >60 mL/min (ref >60)
GLUCOSE: 268 mg/dL — AB (ref 65–99)
Potassium: 3.9 mmol/L (ref 3.5–5.1)
Sodium: 138 mmol/L (ref 135–145)

## 2018-01-26 LAB — CBC WITH DIFFERENTIAL/PLATELET
Basophils Absolute: 0 10*3/uL (ref 0.0–0.1)
Basophils Relative: 0 %
EOS ABS: 0.1 10*3/uL (ref 0.0–0.7)
Eosinophils Relative: 1 %
HEMATOCRIT: 42.8 % (ref 36.0–46.0)
HEMOGLOBIN: 13.7 g/dL (ref 12.0–15.0)
LYMPHS ABS: 2.6 10*3/uL (ref 0.7–4.0)
Lymphocytes Relative: 31 %
MCH: 27.7 pg (ref 26.0–34.0)
MCHC: 32 g/dL (ref 30.0–36.0)
MCV: 86.6 fL (ref 78.0–100.0)
MONO ABS: 0.5 10*3/uL (ref 0.1–1.0)
Monocytes Relative: 6 %
NEUTROS ABS: 5.1 10*3/uL (ref 1.7–7.7)
NEUTROS PCT: 62 %
Platelets: 321 10*3/uL (ref 150–400)
RBC: 4.94 MIL/uL (ref 3.87–5.11)
RDW: 14.4 % (ref 11.5–15.5)
WBC: 8.3 10*3/uL (ref 4.0–10.5)

## 2018-01-26 LAB — D-DIMER, QUANTITATIVE: D-Dimer, Quant: <0.27 ug/mL-FEU (ref 0.00–0.50)

## 2018-01-26 LAB — TROPONIN I: Troponin I: <0.03 ng/mL (ref <0.03)

## 2018-01-26 MED ORDER — POTASSIUM CHLORIDE 10 MEQ/100ML IV SOLN: 10.0000 meq | INTRAVENOUS | Status: DC

## 2018-01-26 MED ORDER — DICLOFENAC SODIUM 50 MG PO TBEC: 50.0000 mg | DELAYED_RELEASE_TABLET | Freq: Two times a day (BID) | ORAL | 0 refills | Status: DC

## 2018-01-26 MED ORDER — POTASSIUM CHLORIDE CRYS ER 20 MEQ PO TBCR: 40.0000 meq | EXTENDED_RELEASE_TABLET | Freq: Once | ORAL | Status: DC

## 2018-01-26 MED ORDER — METHOCARBAMOL 500 MG PO TABS: 500.0000 mg | ORAL_TABLET | Freq: Two times a day (BID) | ORAL | 0 refills | Status: DC

## 2018-01-26 NOTE — Discharge Instructions (Signed)
See your Physician for recheck.  Return if any problems.  °

## 2018-01-26 NOTE — ED Triage Notes (Signed)
Pt c/o central back pain radiating in to chest that is sharp since yesterday. Pain worse with movement or deep breathing.

## 2018-01-27 NOTE — ED Provider Notes (Signed)
[facility] Provider Note   CSN: [id number] Arrival date & time: 01/26/18  0906     History   Chief Complaint Chief Complaint  Patient presents with  . Back Pain  . Chest Pain    HPI Courtney Cox is a 52 y.o. female.  The history is provided by the patient. No language interpreter was used.  Back Pain   This is a new problem. The current episode started yesterday. The problem occurs constantly. The problem has been gradually worsening. The pain is associated with no known injury. The pain is present in the thoracic spine. The quality of the pain is described as stabbing. The pain is moderate. The symptoms are aggravated by bending and twisting. The pain is the same all the time. Associated symptoms include chest pain.  Chest Pain   Associated symptoms include back pain.    Past Medical History:  Diagnosis Date  . Anemia 2008   iron deficiency  . Diabetes mellitus    on watch,not on meds  . History of blood transfusion 02/18/12   received 2 uprbc for hgb 6.5  . History of blood transfusion   . Hyperlipidemia   . Hypertension   . 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 Marcy Panning @ [facility]    Patient Active Problem List   Diagnosis Date Noted  . B12 deficiency 05/13/2017  . Tachycardia 04/22/2017  . Mild dehydration 03/22/2017  . Morbid obesity ([place]) 11/12/2016  . Allergic rhinitis due to allergen 10/17/2015  . Diabetes mellitus with no complication, poor control. 08/08/2014  . Benign hypertension 08/08/2014  . High cholesterol 08/08/2014  . Vitamin D deficiency 08/08/2014  . Hx of iron deficiency anemia 08/08/2014    Past Surgical History:  Procedure Laterality Date  . ABDOMINAL HYSTERECTOMY  03/09/2012   Procedure: HYSTERECTOMY ABDOMINAL;  Surgeon: Allena Katz, MD;  Location[facility];  Service: Gynecology;  Laterality: N/A;   TOTAL ABDOMINAL HYSTERECTOMY  . HYSTEROSCOPY W/  ENDOMETRIAL HYDROTHERMAL ABLATION  12-31-2008   MENOMETRORRHAGIA  . LAPAROSCOPIC ASSISTED VAGINAL HYSTERECTOMY  03/09/2012   Procedure: LAPAROSCOPIC ASSISTED VAGINAL HYSTERECTOMY;  Surgeon: Allena Katz, MD;  Location: [facility];  Service: Gynecology;  Laterality: N/A;  Attempted but had to do abdominal hysterectomy     OB History    Gravida  4   Para  3   Term  3   Preterm      AB  1   Living  3     SAB  1   TAB      Ectopic      Multiple      Live Births               Home Medications    Prior to Admission medications   Medication Sig Start Date End Date Taking? Authorizing Provider  amLODipine (NORVASC) 10 MG tablet Take 1 tablet (10 mg total) by mouth daily. 05/13/17  Yes Bedsole, Amy E, MD  aspirin 81 MG tablet Take 81 mg by mouth daily.   Yes [provider]  glimepiride (AMARYL) 4 MG tablet 2 TABLETS (8 MG TOTAL) BY MOUTH DAILY WITH BREAKFAST. 05/13/17  Yes Bedsole, Amy E, MD  ibuprofen (ADVIL,MOTRIN) 800 MG tablet Take 1 tablet (800 mg total) by mouth every 6 (six) hours as needed for mild pain. 05/22/16  Yes de Villier, Daryl F II, PA  losartan-hydrochlorothiazide (HYZAAR) 100-25  MG tablet Take 1 tablet by mouth daily. 09/20/17  Yes Bedsole, Amy E, MD  metFORMIN (GLUCOPHAGE XR) 500 MG 24 hr tablet Take 3 tablets (1,500 mg total) by mouth daily with breakfast. 05/13/17  Yes Bedsole, Amy E, MD  rosuvastatin (CRESTOR) 40 MG tablet Take 1 tablet (40 mg total) by mouth daily. 05/13/17  Yes Bedsole, Amy E, MD  diclofenac (VOLTAREN) 50 MG EC tablet Take 1 tablet (50 mg total) by mouth 2 (two) times daily. 01/26/18   Fransico Meadow, PA-C  methocarbamol (ROBAXIN) 500 MG tablet Take 1 tablet (500 mg total) by mouth 2 (two) times daily. 01/26/18   Fransico Meadow, PA-C  Vitamin D, Ergocalciferol, (DRISDOL) 50000 units CAPS capsule Take 1 capsule (50,000 Units total) by mouth every 7 (seven) days. Patient not taking: Reported on 01/26/2018 11/12/16    Jinny Sanders, MD  potassium chloride SA (K-DUR,KLOR-CON) 20 MEQ tablet Take 1 tablet (20 mEq total) by mouth 2 (two) times daily. 10/12/11 11/10/11  Marcy Panning, MD    Family History Family History  Problem Relation Age of Onset  . Diabetes Father   . Heart disease Father   . Hypertension Father   . Heart disease Mother   . Diabetes Mother   . Diabetes Sister   . Diabetes Maternal Grandmother   . Diabetes Maternal Grandfather   . Diabetes Paternal Grandmother   . Diabetes Paternal Grandfather     Social History Social History   Tobacco Use  . Smoking status: Never Smoker  . Smokeless tobacco: Never Used  Substance Use Topics  . Alcohol use: Yes    Alcohol/week: 0.0 oz    Comment: socially  . Drug use: No     Allergies   Patient has no known allergies.   Review of Systems Review of Systems  Cardiovascular: Positive for chest pain.  Musculoskeletal: Positive for back pain.  All other systems reviewed and are negative.    Physical Exam Updated Vital Signs BP (!) 184/97   Pulse 93   Temp 98 F (36.7 C) (Oral)   Resp 20   LMP 02/07/2012   SpO2 97%   Physical Exam  Constitutional: She is oriented to person, place, and time. She appears well-developed and well-nourished.  HENT:  Head: Normocephalic.  Eyes: EOM are normal.  Neck: Normal range of motion.  Pulmonary/Chest: Effort normal and breath sounds normal.  Abdominal: She exhibits no distension.  Musculoskeletal: Normal range of motion.  Tender mid thoracic and lower thoracic spine and upper back,    Neurological: She is alert and oriented to person, place, and time.  Skin: Skin is warm.  Psychiatric: She has a normal mood and affect.  Nursing note and vitals reviewed.    ED Treatments / Results  Labs (all labs ordered are listed, but only abnormal results are displayed) Labs Reviewed  BASIC METABOLIC PANEL - Abnormal; Notable for the following components:      Result Value   Glucose, Bld  268 (*)    All other components within normal limits  CBC WITH DIFFERENTIAL/PLATELET  TROPONIN I  D-DIMER, QUANTITATIVE (NOT AT [facility])    EKG EKG Interpretation  Date/Time:  Thursday January 26 2018 09:26:13 EDT Ventricular Rate:  86 PR Interval:    QRS Duration: 100 QT Interval:  367 QTC Calculation: 439 R Axis:   14 Text Interpretation:  Sinus rhythm ST elev, probable normal early repol pattern Baseline wander When compared with ECG of 04/09/2016 No significant change was  found Confirmed by Francine Graven [phone]) on 01/26/2018 9:29:48 AM   Radiology Dg Chest 2 View  Result Date: 01/26/2018 CLINICAL DATA:  Pt c/o central back pain radiating in to chest that is sharp since yesterday. Pain worse with movement or deep breathing. diabetic/htn/non smoker EXAM: CHEST - 2 VIEW COMPARISON:  04/09/2016 FINDINGS: The heart size and mediastinal contours are within normal limits. Both lungs are clear. The visualized skeletal structures are unremarkable. IMPRESSION: No active cardiopulmonary disease. Electronically Signed   By: Nolon Nations M.D.   On: 01/26/2018 10:19    Procedures Procedures (including critical care time)  Medications Ordered in ED Medications - No data to display   Initial Impression / Assessment and Plan / ED Course  I have reviewed the triage vital signs and the nursing notes.  Pertinent labs & imaging results that were available during my care of the patient were reviewed by me and considered in my medical decision making (see chart for details).    MDM  Negative ddimer, chest xray normal,   I suspect pain is muscular.  Pt is unable to obtain ua while in ED.  Pt does not think she has a uti.     Final Clinical Impressions(s) / ED Diagnoses   Final diagnoses:  Chest wall pain    ED Discharge Orders        Ordered    diclofenac (VOLTAREN) 50 MG EC tablet  2 times daily     01/26/18 1311    methocarbamol (ROBAXIN) 500 MG tablet  2 times daily     01/26/18  1311    An After Visit Summary was printed and given to the patient.    Fransico Meadow, PA-C 01/27/18 [place], [place], DO 01/30/18 [id number]

## 2018-03-01 ENCOUNTER — Other Ambulatory Visit: Payer: Self-pay | Admitting: Family Medicine

## 2018-05-23 NOTE — Telephone Encounter (Signed)
Patient called, left VM to return call to the office to discuss blood sugars.

## 2018-05-23 NOTE — Telephone Encounter (Signed)
This encounter was created in error - please disregard.

## 2018-06-02 ENCOUNTER — Encounter: Payer: Self-pay | Admitting: Family Medicine

## 2018-06-02 ENCOUNTER — Ambulatory Visit (INDEPENDENT_AMBULATORY_CARE_PROVIDER_SITE_OTHER): Payer: Self-pay | Admitting: Family Medicine

## 2018-06-02 VITALS — BP 146/92 | HR 81 | Temp 97.8°F | Ht 65.5 in | Wt 299.0 lb

## 2018-06-02 DIAGNOSIS — E7800 Pure hypercholesterolemia, unspecified: Secondary | ICD-10-CM

## 2018-06-02 DIAGNOSIS — I10 Essential (primary) hypertension: Secondary | ICD-10-CM

## 2018-06-02 DIAGNOSIS — M722 Plantar fascial fibromatosis: Secondary | ICD-10-CM

## 2018-06-02 DIAGNOSIS — E119 Type 2 diabetes mellitus without complications: Secondary | ICD-10-CM

## 2018-06-02 LAB — COMPREHENSIVE METABOLIC PANEL WITH GFR
ALT: 33 U/L (ref 0–35)
AST: 27 U/L (ref 0–37)
Albumin: 4 g/dL (ref 3.5–5.2)
Alkaline Phosphatase: 51 U/L (ref 39–117)
BUN: 8 mg/dL (ref 6–23)
CO2: 30 meq/L (ref 19–32)
Calcium: 9.7 mg/dL (ref 8.4–10.5)
Chloride: 102 meq/L (ref 96–112)
Creatinine, Ser: 0.81 mg/dL (ref 0.40–1.20)
GFR: 96.07 mL/min (ref >60.00)
Glucose, Bld: 205 mg/dL — ABNORMAL HIGH (ref 70–99)
Potassium: 4.1 meq/L (ref 3.5–5.1)
Sodium: 139 meq/L (ref 135–145)
Total Bilirubin: 0.6 mg/dL (ref 0.2–1.2)
Total Protein: 7.6 g/dL (ref 6.0–8.3)

## 2018-06-02 LAB — LIPID PANEL
CHOL/HDL RATIO: 6
Cholesterol: 196 mg/dL (ref 0–200)
HDL: 33.4 mg/dL — ABNORMAL LOW (ref >39.00)
LDL Cholesterol: 139 mg/dL — ABNORMAL HIGH (ref 0–99)
NONHDL: 162.59
TRIGLYCERIDES: 118 mg/dL (ref 0.0–149.0)
VLDL: 23.6 mg/dL (ref 0.0–40.0)

## 2018-06-02 LAB — HEMOGLOBIN A1C: Hgb A1c MFr Bld: 9.4 % — ABNORMAL HIGH (ref 4.6–6.5)

## 2018-06-02 LAB — HM DIABETES FOOT EXAM

## 2018-06-02 MED ORDER — METFORMIN HCL ER 500 MG PO TB24: 2000.0000 mg | ORAL_TABLET | Freq: Every day | ORAL | 1 refills | Status: DC

## 2018-06-02 NOTE — Addendum Note (Signed)
Addended by: Carter Kitten on: 06/02/2018 04:54 PM   Modules accepted: Orders

## 2018-06-02 NOTE — Addendum Note (Signed)
Addended by: Ellamae Sia on: 06/02/2018 10:12 AM   Modules accepted: Orders

## 2018-06-02 NOTE — Assessment & Plan Note (Signed)
Due for re-eval. 

## 2018-06-02 NOTE — Progress Notes (Signed)
Subjective:    Patient ID: Courtney Cox, female    DOB: 1966-01-01, 52 y.o.   MRN: [id number]  HPI  52 year old female presents for follow up  She is unemployeed since 09/2017.Marland Kitchen Looking for a job, has started one that she does not like.. She has no insurance.  Due for CPX.Clotilde Dieter and coloncoscopy.  Diabetes:  Due for re-eval. On metformin, amaryl Lab Results  Component Value Date   HGBA1C 10.2 (H) 05/13/2017  Using medications without difficulties: Hypoglycemic episodes:none  She is currently dealing with plantar fasciitis. Hyperglycemic episodes: occ Feet problems: no ulcer Blood Sugars averaging: FBS 182-189 eye exam within last year: due  Hypertension:   Elevated in office today.. On losartan HCTZ, amlodipine max  Using medication without problems or lightheadedness:  Chest pain with exertion:none Edema:none Short of breath: none Average home BPs: at home BP elevated as wel for years.  BP Readings from Last 3 Encounters:  06/02/18 (!) 146/92  01/26/18 (!) 184/97  05/13/17 (!) 158/110   Wt Readings from Last 3 Encounters:  06/02/18 299 lb (135.6 kg)  05/13/17 (!) 309 lb (140.2 kg)  04/22/17 (!) 304 lb 8 oz (138.1 kg)  Other issues:  Elevated Cholesterol:  Due for re-eval on crestor. Lab Results  Component Value Date   CHOL 199 05/13/2017   HDL 38 (L) 05/13/2017   LDLCALC 142 (H) 05/13/2017   TRIG 86 05/13/2017   CHOLHDL 5.2 (H) 05/13/2017  Using medications without problems: Muscle aches:  Diet compliance: decreased appetite, Exercise: none Other complaints:   Blood pressure (!) 146/92, pulse 81, temperature 97.8 F (36.6 C), temperature source Oral, height 5' 5.5" (1.664 m), weight 299 lb (135.6 kg), last menstrual period 02/07/2012, SpO2 98 %. Social History /Family History/Past Medical History reviewed in detail and updated in EMR if needed.  Review of Systems  Constitutional: Negative for fatigue and fever.  HENT: Negative for congestion.   Eyes:  Negative for pain.  Respiratory: Negative for cough and shortness of breath.   Cardiovascular: Negative for chest pain, palpitations and leg swelling.  Gastrointestinal: Negative for abdominal pain.  Genitourinary: Negative for dysuria and vaginal bleeding.  Musculoskeletal: Negative for back pain.  Neurological: Negative for syncope, light-headedness and headaches.  Psychiatric/Behavioral: Negative for dysphoric mood.       Objective:   Physical Exam  Constitutional: Vital signs are normal. She appears well-developed and well-nourished. She is cooperative.  Non-toxic appearance. She does not appear ill. No distress.  Obesity, morbid  HENT:  Head: Normocephalic.  Right Ear: Hearing, tympanic membrane, external ear and ear canal normal. Tympanic membrane is not erythematous, not retracted and not bulging.  Left Ear: Hearing, tympanic membrane, external ear and ear canal normal. Tympanic membrane is not erythematous, not retracted and not bulging.  Nose: No mucosal edema or rhinorrhea. Right sinus exhibits no maxillary sinus tenderness and no frontal sinus tenderness. Left sinus exhibits no maxillary sinus tenderness and no frontal sinus tenderness.  Mouth/Throat: Uvula is midline, oropharynx is clear and moist and mucous membranes are normal.  Eyes: Pupils are equal, round, and reactive to light. Conjunctivae, EOM and lids are normal. Lids are everted and swept, no foreign bodies found.  Neck: Trachea normal and normal range of motion. Neck supple. Carotid bruit is not present. No thyroid mass and no thyromegaly present.  Cardiovascular: Normal rate, regular rhythm, S1 normal, S2 normal, normal heart sounds, intact distal pulses and normal pulses. Exam reveals no gallop and no friction  rub.  No murmur heard. Pulmonary/Chest: Effort normal and breath sounds normal. No tachypnea. No respiratory distress. She has no decreased breath sounds. She has no wheezes. She has no rhonchi. She has no  rales.  Abdominal: Soft. Normal appearance and bowel sounds are normal. There is no tenderness.  Neurological: She is alert.  Skin: Skin is warm, dry and intact. No rash noted.  Psychiatric: Her speech is normal and behavior is normal. Judgment and thought content normal. Her mood appears not anxious. Cognition and memory are normal. She does not exhibit a depressed mood.     Diabetic foot exam: Normal inspection, ttp over left plantar fascia insertion. No skin breakdown No calluses  Normal DP pulses Normal sensation to light touch and monofilament Nails normal      Assessment & Plan:

## 2018-06-02 NOTE — Assessment & Plan Note (Signed)
INadequate control in office today... Follow at home. Will likely need additional med to curent regimen.. She is hesitant to start today.

## 2018-06-02 NOTE — Patient Instructions (Addendum)
Please stop at the lab to have labs drawn. Get back to regular exercise (daily walking), don't skip meals, low carbohydrate diet.  Follow blood pressure at home..if it continue stop be > 140/90 in next few week call to start a new BP med.  Please stop at the front desk to set up referral.  Set up yearly eye exam when able.

## 2018-06-02 NOTE — Assessment & Plan Note (Signed)
Encouraged exercise, weight loss, healthy eating habits. ? ?

## 2018-06-28 ENCOUNTER — Ambulatory Visit: Payer: No Typology Code available for payment source | Admitting: Podiatry

## 2018-06-28 ENCOUNTER — Encounter: Payer: Self-pay | Admitting: Podiatry

## 2018-06-28 ENCOUNTER — Ambulatory Visit: Payer: No Typology Code available for payment source

## 2018-06-28 DIAGNOSIS — M722 Plantar fascial fibromatosis: Secondary | ICD-10-CM

## 2018-06-28 MED ORDER — MELOXICAM 15 MG PO TABS: 15.0000 mg | ORAL_TABLET | Freq: Every day | ORAL | 3 refills | Status: DC

## 2018-06-28 NOTE — Patient Instructions (Signed)

## 2018-06-28 NOTE — Progress Notes (Signed)
Subjective:  Patient ID: Courtney Cox, female    DOB: [birth year],  MRN: [id number] HPI Chief Complaint  Patient presents with  . Foot Pain    Patient presents today for left heel pain x 2 months.  She reports a sharp pain when standing from sitting and pain is worse in the mornings.  Shehas been taking Ibuprofen, frozen water bottles and massage with temporary relief    52 y.o. female presents with the above complaint.  She presents today chief complaint of pain to the left heel.  Been going on now for about 2 months states that she had plantar fasciitis in the past currently has a new job standing on her feet a lot.  She is tried frozen water bottle nothing seems to work.     Past Medical History:  Diagnosis Date  . Anemia 2008   iron deficiency  . Diabetes mellitus    on watch,not on meds  . History of blood transfusion 02/18/12   received 2 uprbc for hgb 6.5  . History of blood transfusion   . Hyperlipidemia   . Hypertension   . 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 Marcy Panning @ [facility]   Past Surgical History:  Procedure Laterality Date  . ABDOMINAL HYSTERECTOMY  03/09/2012   Procedure: HYSTERECTOMY ABDOMINAL;  Surgeon: Allena Katz, MD;  Location: [facility];  Service: Gynecology;  Laterality: N/A;   TOTAL ABDOMINAL HYSTERECTOMY  . HYSTEROSCOPY W/ ENDOMETRIAL HYDROTHERMAL ABLATION  12-31-2008   MENOMETRORRHAGIA  . LAPAROSCOPIC ASSISTED VAGINAL HYSTERECTOMY  03/09/2012   Procedure: LAPAROSCOPIC ASSISTED VAGINAL HYSTERECTOMY;  Surgeon: Allena Katz, MD;  Location: [facility];  Service: Gynecology;  Laterality: N/A;  Attempted but had to do abdominal hysterectomy    Current Outpatient Medications:  .  amLODipine (NORVASC) 10 MG tablet, Take 1 tablet (10 mg total) by mouth daily., Disp: 90 tablet, Rfl: 3 .  aspirin 81 MG tablet, Take 81 mg by mouth daily., Disp: , Rfl:  .  glimepiride (AMARYL) 4  MG tablet, 2 TABLETS (8 MG TOTAL) BY MOUTH DAILY WITH BREAKFAST., Disp: 180 tablet, Rfl: 3 .  ibuprofen (ADVIL,MOTRIN) 800 MG tablet, Take 1 tablet (800 mg total) by mouth every 6 (six) hours as needed for mild pain., Disp: 30 tablet, Rfl: 0 .  losartan-hydrochlorothiazide (HYZAAR) 100-25 MG tablet, Take 1 tablet by mouth daily., Disp: 90 tablet, Rfl: 3 .  meloxicam (MOBIC) 15 MG tablet, Take 1 tablet (15 mg total) by mouth daily., Disp: 30 tablet, Rfl: 3 .  metFORMIN (GLUCOPHAGE-XR) 500 MG 24 hr tablet, Take 4 tablets (2,000 mg total) by mouth daily with breakfast., Disp: 360 tablet, Rfl: 1 .  rosuvastatin (CRESTOR) 40 MG tablet, Take 1 tablet (40 mg total) by mouth daily., Disp: 90 tablet, Rfl: 3  No Known Allergies Review of Systems Objective:  There were no vitals filed for this visit.  General: Well developed, nourished, in no acute distress, alert and oriented x3   Dermatological: Skin is warm, dry and supple bilateral. Nails x 10 are well maintained; remaining integument appears unremarkable at this time. There are no open sores, no preulcerative lesions, no rash or signs of infection present.  Vascular: Dorsalis Pedis artery and Posterior Tibial artery pedal pulses are 2/4 bilateral with immedate capillary fill time. Pedal hair growth present. No varicosities and no lower extremity edema present bilateral.   Neruologic: Grossly intact via light touch bilateral. Vibratory  intact via tuning fork bilateral. Protective threshold with Semmes Wienstein monofilament intact to all pedal sites bilateral. Patellar and Achilles deep tendon reflexes 2+ bilateral. No Babinski or clonus noted bilateral.   Musculoskeletal: No gross boney pedal deformities bilateral. No pain, crepitus, or limitation noted with foot and ankle range of motion bilateral. Muscular strength 5/5 in all groups tested bilateral.  Pain on palpation medial calcaneal tubercle of the left heel.  Gait: Unassisted, Nonantalgic.     Radiographs:  Radiographs taken today demonstrate soft tissue increase in density plantar fashion calcaneal insertion site indicative of plantar fasciitis left heel.  Small plantar distally oriented calcaneal spurs noted.  Slight thickening of the Achilles is also noted.  Assessment & Plan:   Assessment: Discussed etiology pathology conservative versus surgical therapies.  Plan a fasciitis left foot.    Plan: Discussed etiology pathology conservative or surgical therapies.  After sterile Betadine skin prep I injected the left heel 20 mg Kenalog 5 mg Marcaine point maximal tenderness left heel.  Placed in plantar fascial brace and a night splint.  Discussed appropriate shoe gear stretching exercise ice therapy and shoe gear modifications.  Also placed her on meloxicam 15 mg #31 p.o. daily follow-up with her in 1 month.     Keidra Withers T. [place], [place]

## 2018-07-31 ENCOUNTER — Encounter: Payer: Self-pay | Admitting: Podiatry

## 2018-07-31 ENCOUNTER — Ambulatory Visit: Payer: No Typology Code available for payment source | Admitting: Podiatry

## 2018-07-31 DIAGNOSIS — M722 Plantar fascial fibromatosis: Secondary | ICD-10-CM

## 2018-07-31 MED ORDER — LOSARTAN POTASSIUM-HCTZ 100-25 MG PO TABS: 1.0000 | ORAL_TABLET | Freq: Every day | ORAL | 1 refills | Status: DC

## 2018-07-31 MED ORDER — GLIMEPIRIDE 4 MG PO TABS: ORAL_TABLET | ORAL | 1 refills | Status: DC

## 2018-07-31 MED ORDER — ROSUVASTATIN CALCIUM 40 MG PO TABS: 40.0000 mg | ORAL_TABLET | Freq: Every day | ORAL | 3 refills | Status: DC

## 2018-07-31 NOTE — Progress Notes (Signed)
She presents today for follow-up of her plantar fasciitis to her left heel.  She states that there is hardly any pain at all.  She states that I get it every once in a while and is so much less than it was previously I am ecstatic.  States that she is 90+ percent improved.  She states that she is no longer using her night brace to a brace or her medication.  Objective: Vital signs are stable she is alert and oriented x3.  Pulses are palpable.  Neurologic sensorium is intact.  Degenerative flexors are intact.  No tenderness on palpation medial calcaneal tubercle of the left heel.  Assessment: Well-healing plantar fasciitis left 90% improved.  Plan: Discussed etiology pathology and surgical therapies at this point I went ahead and recommend that she continue the anti-inflammatories and the night splint for at least another month.  I will follow-up with her should she have any questions or concerns.

## 2018-08-07 ENCOUNTER — Telehealth: Payer: Self-pay

## 2018-08-07 NOTE — Telephone Encounter (Signed)
No answer

## 2018-08-07 NOTE — Telephone Encounter (Signed)
Emily at front desk said pt had mycharted to get appt for tightness in face. I left v/m requesting pt to cb. I spoke with pts daughter and she said pt only has one contact #.

## 2018-08-07 NOTE — Telephone Encounter (Signed)
Pt called back; pt is having headache in lt temple area since 08/04/18; pt has a feeling like a ban around head. No dizziness, no vision changes or CP or SOB. Pt has not had this type H/A before. BP today was 142/96. Pt said this is what BP usually runs. Pt has been out of losartan HCTZ 100-25 mg for 1 week. [facility] [address] said out of losartan HCTZ 100-25 mg. Pt wants to know if could get 2 separate rx for losartan 100 mg and HCTZ 25 mg to [facility] [address]. Pt has been taking amlodipine 10 mg daily. Pt also scheduled appt with Dr Diona Browner 08/08/18 at 12 noon. If pt condition worsens prior to appt pt will go to [facility].

## 2018-08-08 ENCOUNTER — Encounter: Payer: Self-pay | Admitting: Family Medicine

## 2018-08-08 ENCOUNTER — Ambulatory Visit (INDEPENDENT_AMBULATORY_CARE_PROVIDER_SITE_OTHER): Payer: Self-pay | Admitting: Family Medicine

## 2018-08-08 VITALS — BP 122/90 | HR 101 | Temp 98.3°F | Ht 65.5 in | Wt 295.0 lb

## 2018-08-08 DIAGNOSIS — E119 Type 2 diabetes mellitus without complications: Secondary | ICD-10-CM

## 2018-08-08 DIAGNOSIS — G44209 Tension-type headache, unspecified, not intractable: Secondary | ICD-10-CM

## 2018-08-08 DIAGNOSIS — Z0100 Encounter for examination of eyes and vision without abnormal findings: Secondary | ICD-10-CM

## 2018-08-08 DIAGNOSIS — R51 Headache: Secondary | ICD-10-CM

## 2018-08-08 DIAGNOSIS — R519 Headache, unspecified: Secondary | ICD-10-CM | POA: Insufficient documentation

## 2018-08-08 DIAGNOSIS — I10 Essential (primary) hypertension: Secondary | ICD-10-CM

## 2018-08-08 MED ORDER — HYDROCHLOROTHIAZIDE 25 MG PO TABS: 25.0000 mg | ORAL_TABLET | Freq: Every day | ORAL | 3 refills | Status: DC

## 2018-08-08 MED ORDER — LOSARTAN POTASSIUM 100 MG PO TABS: 100.0000 mg | ORAL_TABLET | Freq: Every day | ORAL | 3 refills | Status: DC

## 2018-08-08 NOTE — Assessment & Plan Note (Signed)
Restart BP medication.. may be contributing to head pain.

## 2018-08-08 NOTE — Progress Notes (Signed)
Subjective:    Patient ID: Courtney Cox, female    DOB: [birth year], 52 y.o.   MRN: [id number]  HPI    52 year old female with HTN, DM presents with new onset pain in head.   She reports 6 days of  Left side of head  In temple are sensitive to touch, achy. Does not feel like typical headache but feels like a band above her head.. worse in stressful situations. She is tired, no dizziness.  no rash. No phonophotophobia. No nausea She has been under a lot of stress lately. New job, overwhelmed.   She is on losartan HCTZ and amlodipine  Was improved control of BP... has been out in plast week. BP Readings from Last 3 Encounters:  08/08/18 122/90  06/02/18 (!) 146/92  01/26/18 (!) 184/97   Wt Readings from Last 3 Encounters:  08/08/18 295 lb (133.8 kg)  06/02/18 299 lb (135.6 kg)  05/13/17 (!) 309 lb (140.2 kg)     Social History /Family History/Past Medical History reviewed in detail and updated in EMR if needed. Blood pressure 122/90, pulse (!) 101, temperature 98.3 F (36.8 C), temperature source Oral, height 5' 5.5" (1.664 m), weight 295 lb (133.8 kg), last menstrual period 02/07/2012.  Review of Systems  Constitutional: Negative for fatigue and fever.  HENT: Negative for congestion.   Eyes: Negative for pain.  Respiratory: Negative for cough and shortness of breath.   Cardiovascular: Negative for chest pain, palpitations and leg swelling.  Gastrointestinal: Negative for abdominal pain.  Genitourinary: Negative for dysuria and vaginal bleeding.  Musculoskeletal: Negative for back pain.  Neurological: Negative for syncope, light-headedness and headaches.  Psychiatric/Behavioral: Negative for dysphoric mood.       Objective:   Physical Exam Constitutional:      General: She is not in acute distress.    Appearance: Normal appearance. She is well-developed. She is not ill-appearing or toxic-appearing.  HENT:     Head: Normocephalic.     Right Ear: Hearing,  tympanic membrane, ear canal and external ear normal. Tympanic membrane is not erythematous, retracted or bulging.     Left Ear: Hearing, tympanic membrane, ear canal and external ear normal. Tympanic membrane is not erythematous, retracted or bulging.     Nose: No mucosal edema or rhinorrhea.     Right Sinus: No maxillary sinus tenderness or frontal sinus tenderness.     Left Sinus: No maxillary sinus tenderness or frontal sinus tenderness.     Mouth/Throat:     Pharynx: Uvula midline.  Eyes:     General: Lids are normal. Lids are everted, no foreign bodies appreciated.     Conjunctiva/sclera: Conjunctivae normal.     Pupils: Pupils are equal, round, and reactive to light.  Neck:     Musculoskeletal: Normal range of motion and neck supple.     Thyroid: No thyroid mass or thyromegaly.     Vascular: No carotid bruit.     Trachea: Trachea normal.  Cardiovascular:     Rate and Rhythm: Normal rate and regular rhythm.     Pulses: Normal pulses.     Heart sounds: Normal heart sounds, S1 normal and S2 normal. No murmur. No friction rub. No gallop.   Pulmonary:     Effort: Pulmonary effort is normal. No tachypnea or respiratory distress.     Breath sounds: Normal breath sounds. No decreased breath sounds, wheezing, rhonchi or rales.  Abdominal:     General: Bowel sounds are normal.  Palpations: Abdomen is soft.     Tenderness: There is no abdominal tenderness.  Skin:    General: Skin is warm and dry.     Findings: No rash.     Comments: Mild sensitivity to touch in left forehead  Neurological:     Mental Status: She is alert and oriented to person, place, and time.     GCS: GCS eye subscore is 4. GCS verbal subscore is 5. GCS motor subscore is 6.     Cranial Nerves: No cranial nerve deficit.     Sensory: No sensory deficit.     Motor: No abnormal muscle tone.     Coordination: Coordination normal.     Gait: Gait normal.     Deep Tendon Reflexes: Reflexes are normal and symmetric.      Comments: Nml cerebellar exam   No papilledema  Psychiatric:        Mood and Affect: Mood is not anxious or depressed.        Speech: Speech normal.        Behavior: Behavior normal. Behavior is cooperative.        Thought Content: Thought content normal.        Cognition and Memory: Memory is not impaired. She does not exhibit impaired recent memory or impaired remote memory.        Judgment: Judgment normal.           Assessment & Plan:

## 2018-08-08 NOTE — Assessment & Plan Note (Signed)
Most likely tension headache less likely early onset shingles.  keep BP controlled, rest, fluids, stress reduction.  Tylenol or ibuprofen prn pain. Call if rash appears.

## 2018-08-08 NOTE — Patient Instructions (Addendum)
Please stop at the front desk to set up referral.   Call to set up mammogram on your own.  Rest, increase fluids , decrease stress.  Start neck stretches and massage of neck.  Call if not improving as expected.

## 2018-08-15 ENCOUNTER — Telehealth: Payer: Self-pay

## 2018-08-15 NOTE — Telephone Encounter (Signed)
Left message for patient to call me back to discuss referral and information I found out.

## 2018-08-25 NOTE — Telephone Encounter (Signed)
Left message for patient to call back  

## 2018-09-23 ENCOUNTER — Emergency Department (HOSPITAL_COMMUNITY): Payer: Self-pay

## 2018-09-23 ENCOUNTER — Emergency Department (HOSPITAL_COMMUNITY)
Admission: EM | Admit: 2018-09-23 | Discharge: 2018-09-23 | Disposition: A | Discharge status: Home / Self Care | Payer: Self-pay | Attending: Emergency Medicine | Admitting: Emergency Medicine

## 2018-09-23 ENCOUNTER — Encounter (HOSPITAL_COMMUNITY): Payer: Self-pay | Admitting: Emergency Medicine

## 2018-09-23 ENCOUNTER — Other Ambulatory Visit: Payer: Self-pay

## 2018-09-23 DIAGNOSIS — E119 Type 2 diabetes mellitus without complications: Secondary | ICD-10-CM | POA: Insufficient documentation

## 2018-09-23 DIAGNOSIS — I10 Essential (primary) hypertension: Secondary | ICD-10-CM | POA: Insufficient documentation

## 2018-09-23 DIAGNOSIS — J101 Influenza due to other identified influenza virus with other respiratory manifestations: Secondary | ICD-10-CM

## 2018-09-23 LAB — COMPREHENSIVE METABOLIC PANEL
ALT: 37 U/L (ref 0–44)
AST: 30 U/L (ref 15–41)
Albumin: 4.1 g/dL (ref 3.5–5.0)
Alkaline Phosphatase: 69 U/L (ref 38–126)
Anion gap: 11 (ref 5–15)
BUN: 6 mg/dL (ref 6–20)
CO2: 25 mmol/L (ref 22–32)
Calcium: 9.3 mg/dL (ref 8.9–10.3)
Chloride: 102 mmol/L (ref 98–111)
Creatinine, Ser: 0.73 mg/dL (ref 0.44–1.00)
GFR calc Af Amer: >60 mL/min (ref >60)
GFR calc non Af Amer: >60 mL/min (ref >60)
Glucose, Bld: 178 mg/dL — ABNORMAL HIGH (ref 70–99)
Potassium: 3.6 mmol/L (ref 3.5–5.1)
Sodium: 138 mmol/L (ref 135–145)
Total Bilirubin: 0.3 mg/dL (ref 0.3–1.2)
Total Protein: 8.5 g/dL — ABNORMAL HIGH (ref 6.5–8.1)

## 2018-09-23 LAB — CBC WITH DIFFERENTIAL/PLATELET
ABS IMMATURE GRANULOCYTES: 0.02 10*3/uL (ref 0.00–0.07)
Basophils Absolute: 0 10*3/uL (ref 0.0–0.1)
Basophils Relative: 0 %
Eosinophils Absolute: 0.2 10*3/uL (ref 0.0–0.5)
Eosinophils Relative: 2 %
HCT: 45.8 % (ref 36.0–46.0)
HEMOGLOBIN: 14 g/dL (ref 12.0–15.0)
Immature Granulocytes: 0 %
LYMPHS ABS: 1.6 10*3/uL (ref 0.7–4.0)
Lymphocytes Relative: 16 %
MCH: 26.9 pg (ref 26.0–34.0)
MCHC: 30.6 g/dL (ref 30.0–36.0)
MCV: 87.9 fL (ref 80.0–100.0)
Monocytes Absolute: 0.7 10*3/uL (ref 0.1–1.0)
Monocytes Relative: 7 %
Neutro Abs: 7.6 10*3/uL (ref 1.7–7.7)
Neutrophils Relative %: 75 %
Platelets: 375 10*3/uL (ref 150–400)
RBC: 5.21 MIL/uL — ABNORMAL HIGH (ref 3.87–5.11)
RDW: 13.9 % (ref 11.5–15.5)
WBC: 10 10*3/uL (ref 4.0–10.5)
nRBC: 0 % (ref 0.0–0.2)

## 2018-09-23 LAB — LACTIC ACID, PLASMA: LACTIC ACID, VENOUS: 1.8 mmol/L (ref 0.5–1.9)

## 2018-09-23 LAB — INFLUENZA PANEL BY PCR (TYPE A & B)
Influenza A By PCR: POSITIVE — AB
Influenza B By PCR: NEGATIVE

## 2018-09-23 MED ORDER — OSELTAMIVIR PHOSPHATE 75 MG PO CAPS
75.0000 mg | ORAL_CAPSULE | Freq: Once | ORAL | Status: AC
  Administered 2018-09-23: 75 mg via ORAL
  Filled 2018-09-23: qty 1

## 2018-09-23 MED ORDER — ONDANSETRON 4 MG PO TBDP: 4.0000 mg | ORAL_TABLET | Freq: Three times a day (TID) | ORAL | 0 refills | Status: DC | PRN

## 2018-09-23 MED ORDER — ALBUTEROL SULFATE HFA 108 (90 BASE) MCG/ACT IN AERS
2.0000 | INHALATION_SPRAY | Freq: Once | RESPIRATORY_TRACT | Status: AC
  Administered 2018-09-23: 2 via RESPIRATORY_TRACT
  Filled 2018-09-23: qty 6.7

## 2018-09-23 MED ORDER — ACETAMINOPHEN 325 MG PO TABS
650.0000 mg | ORAL_TABLET | Freq: Once | ORAL | Status: AC | PRN
  Administered 2018-09-23: 650 mg via ORAL
  Filled 2018-09-23: qty 2

## 2018-09-23 MED ORDER — OSELTAMIVIR PHOSPHATE 75 MG PO CAPS: 75.0000 mg | ORAL_CAPSULE | Freq: Two times a day (BID) | ORAL | 0 refills | Status: AC

## 2018-09-23 MED ORDER — SODIUM CHLORIDE 0.9 % IV BOLUS
1000.0000 mL | Freq: Once | INTRAVENOUS | Status: AC
  Administered 2018-09-23: 1000 mL via INTRAVENOUS

## 2018-09-23 MED ORDER — IBUPROFEN 800 MG PO TABS: 800.0000 mg | ORAL_TABLET | Freq: Three times a day (TID) | ORAL | 0 refills | Status: DC | PRN

## 2018-09-23 NOTE — ED Provider Notes (Signed)
Emergency Department Provider Note   I have reviewed the triage vital signs and the nursing notes.   HISTORY  Chief Complaint Cough   HPI Courtney Cox is a 53 y.o. female with PMH of DM, HLD, and HTN presents to the emergency department with flu-like symptoms starting yesterday.  The patient has had 2 weeks of cough which is been mild.  She began having chills and body aches yesterday with subjective fevers at home.  She has chest discomfort only with coughing and not at rest.  She denies any shortness of breath.  No hemoptysis.  No abdominal discomfort, vomiting, diarrhea symptoms.  No known sick contacts.  Patient does not smoke and is not exposed to secondhand smoke.    Past Medical History:  Diagnosis Date  . Anemia 2008   iron deficiency  . Diabetes mellitus    on watch,not on meds  . History of blood transfusion 02/18/12   received 2 uprbc for hgb 6.5  . History of blood transfusion   . Hyperlipidemia   . Hypertension   . 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 Marcy Panning @ [facility]    Patient Active Problem List   Diagnosis Date Noted  . Headache 08/08/2018  . B12 deficiency 05/13/2017  . Tachycardia 04/22/2017  . Mild dehydration 03/22/2017  . Morbid obesity ([place]) 11/12/2016  . Allergic rhinitis due to allergen 10/17/2015  . Diabetes mellitus with no complication, poor control. 08/08/2014  . Benign hypertension 08/08/2014  . High cholesterol 08/08/2014  . Vitamin D deficiency 08/08/2014  . Hx of iron deficiency anemia 08/08/2014    Past Surgical History:  Procedure Laterality Date  . ABDOMINAL HYSTERECTOMY  03/09/2012   Procedure: HYSTERECTOMY ABDOMINAL;  Surgeon: Allena Katz, MD;  Location: [facility];  Service: Gynecology;  Laterality: N/A;   TOTAL ABDOMINAL HYSTERECTOMY  . HYSTEROSCOPY W/ ENDOMETRIAL HYDROTHERMAL ABLATION  12-31-2008   MENOMETRORRHAGIA  . LAPAROSCOPIC ASSISTED VAGINAL  HYSTERECTOMY  03/09/2012   Procedure: LAPAROSCOPIC ASSISTED VAGINAL HYSTERECTOMY;  Surgeon: Allena Katz, MD;  Location: [facility];  Service: Gynecology;  Laterality: N/A;  Attempted but had to do abdominal hysterectomy   Allergies Patient has no known allergies.  Family History  Problem Relation Age of Onset  . Diabetes Father   . Heart disease Father   . Hypertension Father   . Heart disease Mother   . Diabetes Mother   . Diabetes Sister   . Diabetes Maternal Grandmother   . Diabetes Maternal Grandfather   . Diabetes Paternal Grandmother   . Diabetes Paternal Grandfather     Social History Social History   Tobacco Use  . Smoking status: Never Smoker  . Smokeless tobacco: Never Used  Substance Use Topics  . Alcohol use: Yes    Alcohol/week: 0.0 standard drinks    Comment: socially  . Drug use: No    Review of Systems  Constitutional: Positive subjective fever and body aches.  Eyes: No visual changes. ENT: No sore throat. Positive congestion.  Cardiovascular: CP with coughing only. Respiratory: Denies shortness of breath. Positive cough.  Gastrointestinal: No abdominal pain.  No nausea, no vomiting.  No diarrhea.  No constipation. Genitourinary: Negative for dysuria. Musculoskeletal: Negative for back pain. Skin: Negative for rash. Neurological: Negative for focal weakness or numbness. Positive mild HA.   10-point ROS otherwise negative.  ____________________________________________   PHYSICAL EXAM:  VITAL SIGNS: ED Triage Vitals [09/23/18 1923]  Enc Vitals Group     BP (!) 188/107     Pulse Rate (!) 139     Resp 18     Temp 100.3 F (37.9 C)     Temp Source Oral     SpO2 99 %     Pain Score 7   Constitutional: Alert and oriented. Well appearing and in no acute distress. Eyes: Conjunctivae are normal.  Head: Atraumatic. Nose: Positive congestion/rhinnorhea. Mouth/Throat: Mucous membranes are moist. Neck: No stridor.     Cardiovascular: Sinus tachycardia. Good peripheral circulation. Grossly normal heart sounds.   Respiratory: Normal respiratory effort.  No retractions. Lungs CTAB. Gastrointestinal: Soft and nontender. No distention.  Musculoskeletal: No lower extremity tenderness nor edema. No gross deformities of extremities. Neurologic:  Normal speech and language. No gross focal neurologic deficits are appreciated.  Skin:  Skin is warm, dry and intact. No rash noted.  ____________________________________________   LABS (all labs ordered are listed, but only abnormal results are displayed)  Labs Reviewed  COMPREHENSIVE METABOLIC PANEL - Abnormal; Notable for the following components:      Result Value   Glucose, Bld 178 (*)    Total Protein 8.5 (*)    All other components within normal limits  CBC WITH DIFFERENTIAL/PLATELET - Abnormal; Notable for the following components:   RBC 5.21 (*)    All other components within normal limits  INFLUENZA PANEL BY PCR (TYPE A & B) - Abnormal; Notable for the following components:   Influenza A By PCR POSITIVE (*)    All other components within normal limits  CULTURE, BLOOD (ROUTINE X 2)  CULTURE, BLOOD (ROUTINE X 2)  LACTIC ACID, PLASMA   ____________________________________________  EKG   EKG Interpretation  Date/Time:  Saturday September 23 2018 21:34:09 EST Ventricular Rate:  133 PR Interval:    QRS Duration: 92 QT Interval:  285 QTC Calculation: 424 R Axis:   4 Text Interpretation:  Sinus tachycardia LVH by voltage No STEMI.  Confirmed by Nanda Quinton [phone]) on 09/23/2018 9:41:21 PM       ____________________________________________  RADIOLOGY  Dg Chest 2 View  Result Date: 09/23/2018 CLINICAL DATA:  Coughing for 2 weeks. EXAM: CHEST - 2 VIEW COMPARISON:  01/26/2018 FINDINGS: The heart size and mediastinal contours are within normal limits. Both lungs are clear. No pleural effusion or pneumothorax. The visualized skeletal structures are  unremarkable. IMPRESSION: No active cardiopulmonary disease. Electronically Signed   By: Lajean Manes M.D.   On: 09/23/2018 19:46    ____________________________________________   PROCEDURES  Procedure(s) performed:   Procedures  None ____________________________________________   INITIAL IMPRESSION / ASSESSMENT AND PLAN / ED COURSE  Pertinent labs & imaging results that were available during my care of the patient were reviewed by me and considered in my medical decision making (see chart for details).  Patient presents to the emergency department with flulike symptoms.  She has no hypoxemia but is tachycardic to the 130s.  No nebulizers given prior to this reading.  Her chest pain is only with coughing and symptoms appear viral.  My suspicion for PE is extremely low.  My suspicion for acute coronary syndrome is also very low.  Plan for screening labs given the tachycardia along with IV fluids and flu testing. CXR with no infiltrate.   Lab work resulting showing flu PCR positive for flu A.  No acute kidney injury or leukocytosis.  Lactate is normal.  Patient's tachycardia likely related to borderline fever.  Patient does have significant  flulike symptoms.  Plan for treatment with Tamiflu.  Discussed the side effects and risk benefit profile of this medication.  Patient also provided albuterol letter.  Will call in additional prescriptions for supportive care and advised close PCP follow-up.  Discussed ED return precautions in detail.  Discussed that if patient has persistent tachycardia and her PCP appointment they may consider cardiology referral but I suspect this is related to her current viral infection.  ____________________________________________  FINAL CLINICAL IMPRESSION(S) / ED DIAGNOSES  Final diagnoses:  Influenza A    MEDICATIONS GIVEN DURING THIS VISIT:  Medications  acetaminophen (TYLENOL) tablet 650 mg (650 mg Oral Given 09/23/18 1930)  sodium chloride 0.9 % bolus  1,000 mL (0 mLs Intravenous Stopped 09/23/18 2248)  oseltamivir (TAMIFLU) capsule 75 mg (75 mg Oral Given 09/23/18 2332)  albuterol (PROVENTIL HFA;VENTOLIN HFA) 108 (90 Base) MCG/ACT inhaler 2 puff (2 puffs Inhalation Given 09/23/18 2318)    Note:  This document was prepared using Dragon voice recognition software and may include unintentional dictation errors.  Nanda Quinton, MD Emergency Medicine    , [facility], MD 09/23/18 [phone]

## 2018-09-23 NOTE — Discharge Instructions (Signed)

## 2018-09-23 NOTE — ED Triage Notes (Signed)
Pt reports coughing for two weeks. Began with increased coughing/chills today. 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

## 2018-09-23 NOTE — ED Notes (Signed)
Brought to FT and transferred to Acute due to HR of 133 as well as HTN

## 2018-09-28 LAB — CULTURE, BLOOD (ROUTINE X 2)
Culture: NO GROWTH
Culture: NO GROWTH
Special Requests: ADEQUATE
Special Requests: ADEQUATE

## 2018-09-29 ENCOUNTER — Ambulatory Visit (INDEPENDENT_AMBULATORY_CARE_PROVIDER_SITE_OTHER): Payer: Medicaid Other | Admitting: Primary Care

## 2018-09-29 ENCOUNTER — Encounter: Payer: Self-pay | Admitting: Primary Care

## 2018-09-29 ENCOUNTER — Ambulatory Visit: Payer: Medicaid Other | Admitting: Family Medicine

## 2018-09-29 DIAGNOSIS — J101 Influenza due to other identified influenza virus with other respiratory manifestations: Secondary | ICD-10-CM | POA: Insufficient documentation

## 2018-09-29 DIAGNOSIS — I10 Essential (primary) hypertension: Secondary | ICD-10-CM

## 2018-09-29 NOTE — Assessment & Plan Note (Signed)
Diagnosed on 09/23/18 during ED visit. Was unable to get Tamiflu filled due to cost. Discussed that this was okay given that her symptoms had been present for 2 weeks. Exam today overall unremarkable.  I see no reason why she cannot return to work. She appears well.  Work note provided.

## 2018-09-29 NOTE — Assessment & Plan Note (Signed)
Above goal today, also during ED visit but with improvement. Discussed to monitor BP and to report continued elevated readings to PCP.

## 2018-09-29 NOTE — Patient Instructions (Signed)
You may return to work.  Monitor your blood pressure and notify Dr. Diona Browner if you see readings consistently at or above 135/90.  It was a pleasure meeting you!

## 2018-09-29 NOTE — Progress Notes (Signed)
Subjective:    Patient ID: Courtney Cox, female    DOB: 08-08-1966, 53 y.o.   MRN: [id number]  HPI  Ms. Steenbergen is a 53 year old female who presents today for emergency department follow up. Patient of Dr. Diona Browner.   She presented to [facility] ED on 09/23/18 with complaints of a 2 week history of cough, chills, body aches.   During her ED visit her chest xray was negative was unremarkable. She was noted to be tachycardic in the 130's. Lab work with positive influenza test, otherwise unremarkable. She was treated with IV fluids and provided a prescription for Tamiflu.   Since her ED visit she was unable to get the Tamiflu due to cost. She did take Coricidin. She's feeling much better overall. She has some residual fatigue and cough. She denies fevers. She is needing a work note to return to work.   BP Readings from Last 3 Encounters:  09/29/18 (!) 154/94  09/23/18 (!) 174/99  08/08/18 122/90     Review of Systems  Constitutional: Positive for fatigue. Negative for fever.  HENT: Positive for postnasal drip. Negative for congestion.   Respiratory: Positive for cough. Negative for shortness of breath.   Cardiovascular: Negative for chest pain.       Past Medical History:  Diagnosis Date  . Anemia 2008   iron deficiency  . Diabetes mellitus    on watch,not on meds  . History of blood transfusion 02/18/12   received 2 uprbc for hgb 6.5  . History of blood transfusion   . Hyperlipidemia   . Hypertension   . 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 Marcy Panning @ [place]     Social History   Socioeconomic History  . Marital status: Single    Spouse name: Not on file  . Number of children: Not on file  . Years of education: Not on file  . Highest education level: Not on file  Occupational History  . Occupation: pre Brewing technologist    Comment: Agricultural engineer  Social Needs  . Financial resource strain: Not on file  . Food insecurity:    Worry:  Not on file    Inability: Not on file  . Transportation needs:    Medical: Not on file    Non-medical: Not on file  Tobacco Use  . Smoking status: Never Smoker  . Smokeless tobacco: Never Used  Substance and Sexual Activity  . Alcohol use: Yes    Alcohol/week: 0.0 standard drinks    Comment: socially  . Drug use: No  . Sexual activity: Yes    Birth control/protection: Surgical  Lifestyle  . Physical activity:    Days per week: Not on file    Minutes per session: Not on file  . Stress: Not on file  Relationships  . Social connections:    Talks on phone: Not on file    Gets together: Not on file    Attends religious service: Not on file    Active member of club or organization: Not on file    Attends meetings of clubs or organizations: Not on file    Relationship status: Not on file  . Intimate partner violence:    Fear of current or ex partner: Not on file    Emotionally abused: Not on file    Physically abused: Not on file    Forced sexual activity: Not on file  Other Topics Concern  .  Not on file  Social History Narrative   Single, 3 kids daughters grown.    Past Surgical History:  Procedure Laterality Date  . ABDOMINAL HYSTERECTOMY  03/09/2012   Procedure: HYSTERECTOMY ABDOMINAL;  Surgeon: Allena Katz, MD;  Location: [facility];  Service: Gynecology;  Laterality: N/A;   TOTAL ABDOMINAL HYSTERECTOMY  . HYSTEROSCOPY W/ ENDOMETRIAL HYDROTHERMAL ABLATION  12-31-2008   MENOMETRORRHAGIA  . LAPAROSCOPIC ASSISTED VAGINAL HYSTERECTOMY  03/09/2012   Procedure: LAPAROSCOPIC ASSISTED VAGINAL HYSTERECTOMY;  Surgeon: Allena Katz, MD;  Location: [place];  Service: Gynecology;  Laterality: N/A;  Attempted but had to do abdominal hysterectomy    Family History  Problem Relation Age of Onset  . Diabetes Father   . Heart disease Father   . Hypertension Father   . Heart disease Mother   . Diabetes Mother   . Diabetes Sister   .  Diabetes Maternal Grandmother   . Diabetes Maternal Grandfather   . Diabetes Paternal Grandmother   . Diabetes Paternal Grandfather     No Known Allergies  Current Outpatient Medications on File Prior to Visit  Medication Sig Dispense Refill  . amLODipine (NORVASC) 10 MG tablet Take 1 tablet (10 mg total) by mouth daily. 90 tablet 3  . aspirin 81 MG tablet Take 81 mg by mouth daily.    Marland Kitchen glimepiride (AMARYL) 4 MG tablet 2 TABLETS (8 MG TOTAL) BY MOUTH DAILY WITH BREAKFAST. 180 tablet 1  . hydrochlorothiazide (HYDRODIURIL) 25 MG tablet Take 1 tablet (25 mg total) by mouth daily. 90 tablet 3  . ibuprofen (ADVIL,MOTRIN) 800 MG tablet Take 1 tablet (800 mg total) by mouth every 8 (eight) hours as needed for moderate pain. 21 tablet 0  . losartan (COZAAR) 100 MG tablet Take 1 tablet (100 mg total) by mouth daily. 90 tablet 3  . losartan-hydrochlorothiazide (HYZAAR) 100-25 MG tablet Take 1 tablet by mouth daily. 90 tablet 1  . meloxicam (MOBIC) 15 MG tablet Take 1 tablet (15 mg total) by mouth daily. 30 tablet 3  . metFORMIN (GLUCOPHAGE-XR) 500 MG 24 hr tablet Take 4 tablets (2,000 mg total) by mouth daily with breakfast. 360 tablet 1  . ondansetron (ZOFRAN ODT) 4 MG disintegrating tablet Take 1 tablet (4 mg total) by mouth every 8 (eight) hours as needed for nausea or vomiting. 20 tablet 0  . rosuvastatin (CRESTOR) 40 MG tablet Take 1 tablet (40 mg total) by mouth daily. 90 tablet 3  . [DISCONTINUED] potassium chloride SA (K-DUR,KLOR-CON) 20 MEQ tablet Take 1 tablet (20 mEq total) by mouth 2 (two) times daily. 20 tablet 1   No current facility-administered medications on file prior to visit.     BP (!) 154/94   Pulse 94   Temp 98.2 F (36.8 C) (Oral)   Ht 5' 5.5" (1.664 m)   Wt 295 lb 8 oz (134 kg)   LMP 02/07/2012   SpO2 96%   BMI 48.43 kg/m    Objective:   Physical Exam  Constitutional: She appears well-nourished. She does not appear ill.  HENT:  Right Ear: Tympanic membrane  and ear canal normal.  Left Ear: Tympanic membrane and ear canal normal.  Nose: No mucosal edema. Right sinus exhibits no maxillary sinus tenderness and no frontal sinus tenderness. Left sinus exhibits no maxillary sinus tenderness and no frontal sinus tenderness.  Mouth/Throat: Oropharynx is clear and moist.  Neck: Neck supple.  Cardiovascular: Normal rate and regular rhythm.  Respiratory: Effort normal.  She has no wheezes. She has rhonchi in the left upper field.  Mild rhonchi to LUF, otherwise unremarkable   Skin: Skin is warm and dry.           Assessment & Plan:

## 2018-10-10 NOTE — Telephone Encounter (Signed)
Can you look into an eye MD in [place]? referral order already made in 07/2019.. I am not sure if you have already looked in [place] as she requested in recent [place] note.

## 2018-11-02 ENCOUNTER — Other Ambulatory Visit: Payer: Self-pay | Admitting: Family Medicine

## 2018-11-08 ENCOUNTER — Other Ambulatory Visit: Payer: Self-pay

## 2018-11-08 ENCOUNTER — Encounter: Payer: Self-pay | Admitting: Internal Medicine

## 2018-11-08 ENCOUNTER — Ambulatory Visit (INDEPENDENT_AMBULATORY_CARE_PROVIDER_SITE_OTHER): Payer: Self-pay | Admitting: Internal Medicine

## 2018-11-08 VITALS — BP 148/96 | HR 102 | Temp 98.4°F | Wt 298.0 lb

## 2018-11-08 DIAGNOSIS — J020 Streptococcal pharyngitis: Secondary | ICD-10-CM

## 2018-11-08 DIAGNOSIS — J029 Acute pharyngitis, unspecified: Secondary | ICD-10-CM

## 2018-11-08 LAB — POCT RAPID STREP A (OFFICE): Rapid Strep A Screen: POSITIVE — AB

## 2018-11-08 MED ORDER — AMOXICILLIN 500 MG PO CAPS: 500.0000 mg | ORAL_CAPSULE | Freq: Three times a day (TID) | ORAL | 0 refills | Status: DC

## 2018-11-08 NOTE — Progress Notes (Signed)
Subjective:    Patient ID: Courtney Cox, female    DOB: Jun 29, 1966, 53 y.o.   MRN: [id number]  HPI  Pt presents to the clinic today with c/o sore throat. She reports this started yesterday. She reports it is not really painful, just scratchy. She denies difficulty swallowing. She denies runny nose, nasal congestion, ear pain, cough or SOB. She reports fever up to 99.2, but denies chills or body aches. She has tried Vit C, ibuprofen with some relief. She has not had sick contacts. She does have a history of allergies, DM 2.   Review of Systems      Past Medical History:  Diagnosis Date  . Anemia 2008   iron deficiency  . Diabetes mellitus    on watch,not on meds  . History of blood transfusion 02/18/12   received 2 uprbc for hgb 6.5  . History of blood transfusion   . Hyperlipidemia   . Hypertension   . 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 Marcy Panning @ [place]    Current Outpatient Medications  Medication Sig Dispense Refill  . amLODipine (NORVASC) 10 MG tablet Take 1 tablet (10 mg total) by mouth daily. 90 tablet 3  . aspirin 81 MG tablet Take 81 mg by mouth daily.    Marland Kitchen glimepiride (AMARYL) 4 MG tablet 2 TABLETS (8 MG TOTAL) BY MOUTH DAILY WITH BREAKFAST. 180 tablet 1  . hydrochlorothiazide (HYDRODIURIL) 25 MG tablet Take 1 tablet (25 mg total) by mouth daily. 90 tablet 3  . ibuprofen (ADVIL,MOTRIN) 800 MG tablet Take 1 tablet (800 mg total) by mouth every 8 (eight) hours as needed for moderate pain. 21 tablet 0  . losartan (COZAAR) 100 MG tablet Take 1 tablet (100 mg total) by mouth daily. 90 tablet 3  . losartan-hydrochlorothiazide (HYZAAR) 100-25 MG tablet Take 1 tablet by mouth daily. 90 tablet 1  . meloxicam (MOBIC) 15 MG tablet Take 1 tablet (15 mg total) by mouth daily. 30 tablet 3  . metFORMIN (GLUCOPHAGE-XR) 500 MG 24 hr tablet Take 4 tablets (2,000 mg total) by mouth daily with breakfast. 360 tablet 1  . ondansetron (ZOFRAN  ODT) 4 MG disintegrating tablet Take 1 tablet (4 mg total) by mouth every 8 (eight) hours as needed for nausea or vomiting. 20 tablet 0  . rosuvastatin (CRESTOR) 40 MG tablet Take 1 tablet (40 mg total) by mouth daily. 90 tablet 3   No current facility-administered medications for this visit.     No Known Allergies  Family History  Problem Relation Age of Onset  . Diabetes Father   . Heart disease Father   . Hypertension Father   . Heart disease Mother   . Diabetes Mother   . Diabetes Sister   . Diabetes Maternal Grandmother   . Diabetes Maternal Grandfather   . Diabetes Paternal Grandmother   . Diabetes Paternal Grandfather     Social History   Socioeconomic History  . Marital status: Single    Spouse name: Not on file  . Number of children: Not on file  . Years of education: Not on file  . Highest education level: Not on file  Occupational History  . Occupation: pre Brewing technologist    Comment: Agricultural engineer  Social Needs  . Financial resource strain: Not on file  . Food insecurity:    Worry: Not on file    Inability: Not on file  . Transportation needs:    Medical:  Not on file    Non-medical: Not on file  Tobacco Use  . Smoking status: Never Smoker  . Smokeless tobacco: Never Used  Substance and Sexual Activity  . Alcohol use: Yes    Alcohol/week: 0.0 standard drinks    Comment: socially  . Drug use: No  . Sexual activity: Yes    Birth control/protection: Surgical  Lifestyle  . Physical activity:    Days per week: Not on file    Minutes per session: Not on file  . Stress: Not on file  Relationships  . Social connections:    Talks on phone: Not on file    Gets together: Not on file    Attends religious service: Not on file    Active member of club or organization: Not on file    Attends meetings of clubs or organizations: Not on file    Relationship status: Not on file  . Intimate partner violence:    Fear of current or ex partner: Not on file    Emotionally  abused: Not on file    Physically abused: Not on file    Forced sexual activity: Not on file  Other Topics Concern  . Not on file  Social History Narrative   Single, 3 kids daughters grown.     Constitutional: Denies fever, malaise, fatigue, headache or abrupt weight changes.  HEENT: Pt reports sore throat. Denies eye pain, eye redness, ear pain, ringing in the ears, wax buildup, runny nose, nasal congestion, bloody nose Respiratory: Denies difficulty breathing, shortness of breath, cough or sputum production.   Cardiovascular: Denies chest pain, chest tightness, palpitations or swelling in the hands or feet.   No other specific complaints in a complete review of systems (except as listed in HPI above).  Objective:   Physical Exam  Wt 298 lb (135.2 kg)   LMP 02/07/2012   BMI 48.84 kg/m   Wt Readings from Last 3 Encounters:  11/08/18 298 lb (135.2 kg)  09/29/18 295 lb 8 oz (134 kg)  08/08/18 295 lb (133.8 kg)    General: Appears her stated age, obese, in NAD. Skin: Warm, dry and intact. No rashes noted. HEENT: Head: normal shape and size; Throat/Mouth: Teeth present, mucosa pink and moist, Tonsils 2+, no exudate, lesions or ulcerations noted.  Neck:  No adenopathy noted. Cardiovascular: Normal rate and rhythm.  Pulmonary/Chest: Normal effort and positive vesicular breath sounds. No respiratory distress. No wheezes, rales or ronchi noted.   BMET    Component Value Date/Time   NA 138 09/23/2018 2134   K 3.6 09/23/2018 2134   CL 102 09/23/2018 2134   CO2 25 09/23/2018 2134   GLUCOSE 178 (H) 09/23/2018 2134   BUN 6 09/23/2018 2134   CREATININE 0.73 09/23/2018 2134   CREATININE 0.85 05/13/2017 1525   CALCIUM 9.3 09/23/2018 2134   GFRNONAA >60 09/23/2018 2134   GFRAA >60 09/23/2018 2134    Lipid Panel     Component Value Date/Time   CHOL 196 06/02/2018 1020   TRIG 118.0 06/02/2018 1020   HDL 33.40 (L) 06/02/2018 1020   CHOLHDL 6 06/02/2018 1020   VLDL 23.6  06/02/2018 1020   LDLCALC 139 (H) 06/02/2018 1020   LDLCALC 142 (H) 05/13/2017 1525    CBC    Component Value Date/Time   WBC 10.0 09/23/2018 2134   RBC 5.21 (H) 09/23/2018 2134   HGB 14.0 09/23/2018 2134   HGB 6.5 (LL) 02/17/2012 1323   HCT 45.8 09/23/2018 2134  HCT 20.1 (L) 02/17/2012 1323   PLT 375 09/23/2018 2134   PLT 396 02/17/2012 1323   MCV 87.9 09/23/2018 2134   MCV 85.8 02/17/2012 1323   MCH 26.9 09/23/2018 2134   MCHC 30.6 09/23/2018 2134   RDW 13.9 09/23/2018 2134   RDW 20.4 (H) 02/17/2012 1323   LYMPHSABS 1.6 09/23/2018 2134   LYMPHSABS 2.7 02/17/2012 1323   MONOABS 0.7 09/23/2018 2134   MONOABS 0.6 02/17/2012 1323   EOSABS 0.2 09/23/2018 2134   EOSABS 0.1 02/17/2012 1323   EOSABS 0.2 11/04/2008 1429   BASOSABS 0.0 09/23/2018 2134   BASOSABS 0.0 02/17/2012 1323    Hgb A1C Lab Results  Component Value Date   HGBA1C 9.4 (H) 06/02/2018           Assessment & Plan:   Sore Throat:  RST: positive RX for Amoxil 500 mg TID x 10 days Ibuprofen 800 mg every 8 hours as needed, with food Salt water gargles may be helpful  Return precautions discussed Webb Silversmith, NP

## 2018-11-08 NOTE — Patient Instructions (Signed)

## 2018-11-09 ENCOUNTER — Telehealth: Payer: Self-pay

## 2018-11-09 NOTE — Telephone Encounter (Signed)
Pt had an issue with her Debit Card yesterday and was unable to get her antibiotic until this morning. She is asking if she can get a note to return on Monday instead of tomorrow.

## 2018-11-09 NOTE — Telephone Encounter (Signed)
That is fine 

## 2018-11-10 ENCOUNTER — Encounter: Payer: Self-pay | Admitting: Internal Medicine

## 2018-11-10 ENCOUNTER — Other Ambulatory Visit: Payer: Self-pay | Admitting: Family Medicine

## 2018-11-10 ENCOUNTER — Other Ambulatory Visit: Payer: Self-pay

## 2018-11-10 NOTE — Addendum Note (Signed)
Addended by: Lurlean Nanny on: 11/10/2018 01:51 PM   Modules accepted: Orders

## 2018-11-13 NOTE — Telephone Encounter (Signed)
See mychart msg, note was done and pt printed off mychart

## 2018-11-23 ENCOUNTER — Other Ambulatory Visit: Payer: Medicaid Other

## 2018-11-28 ENCOUNTER — Encounter: Payer: Medicaid Other | Admitting: Family Medicine

## 2018-12-21 IMAGING — DX DG CHEST 2V
2 series · 2 of 2 positions shown · non-contrast
Comparison: 04/09/2016

CLINICAL DATA: Pt c/o central back pain radiating in to chest that
is sharp since yesterday. Pain worse with movement or deep
breathing. diabetic/htn/non smoker

EXAM:
CHEST - 2 VIEW

[chest pa]
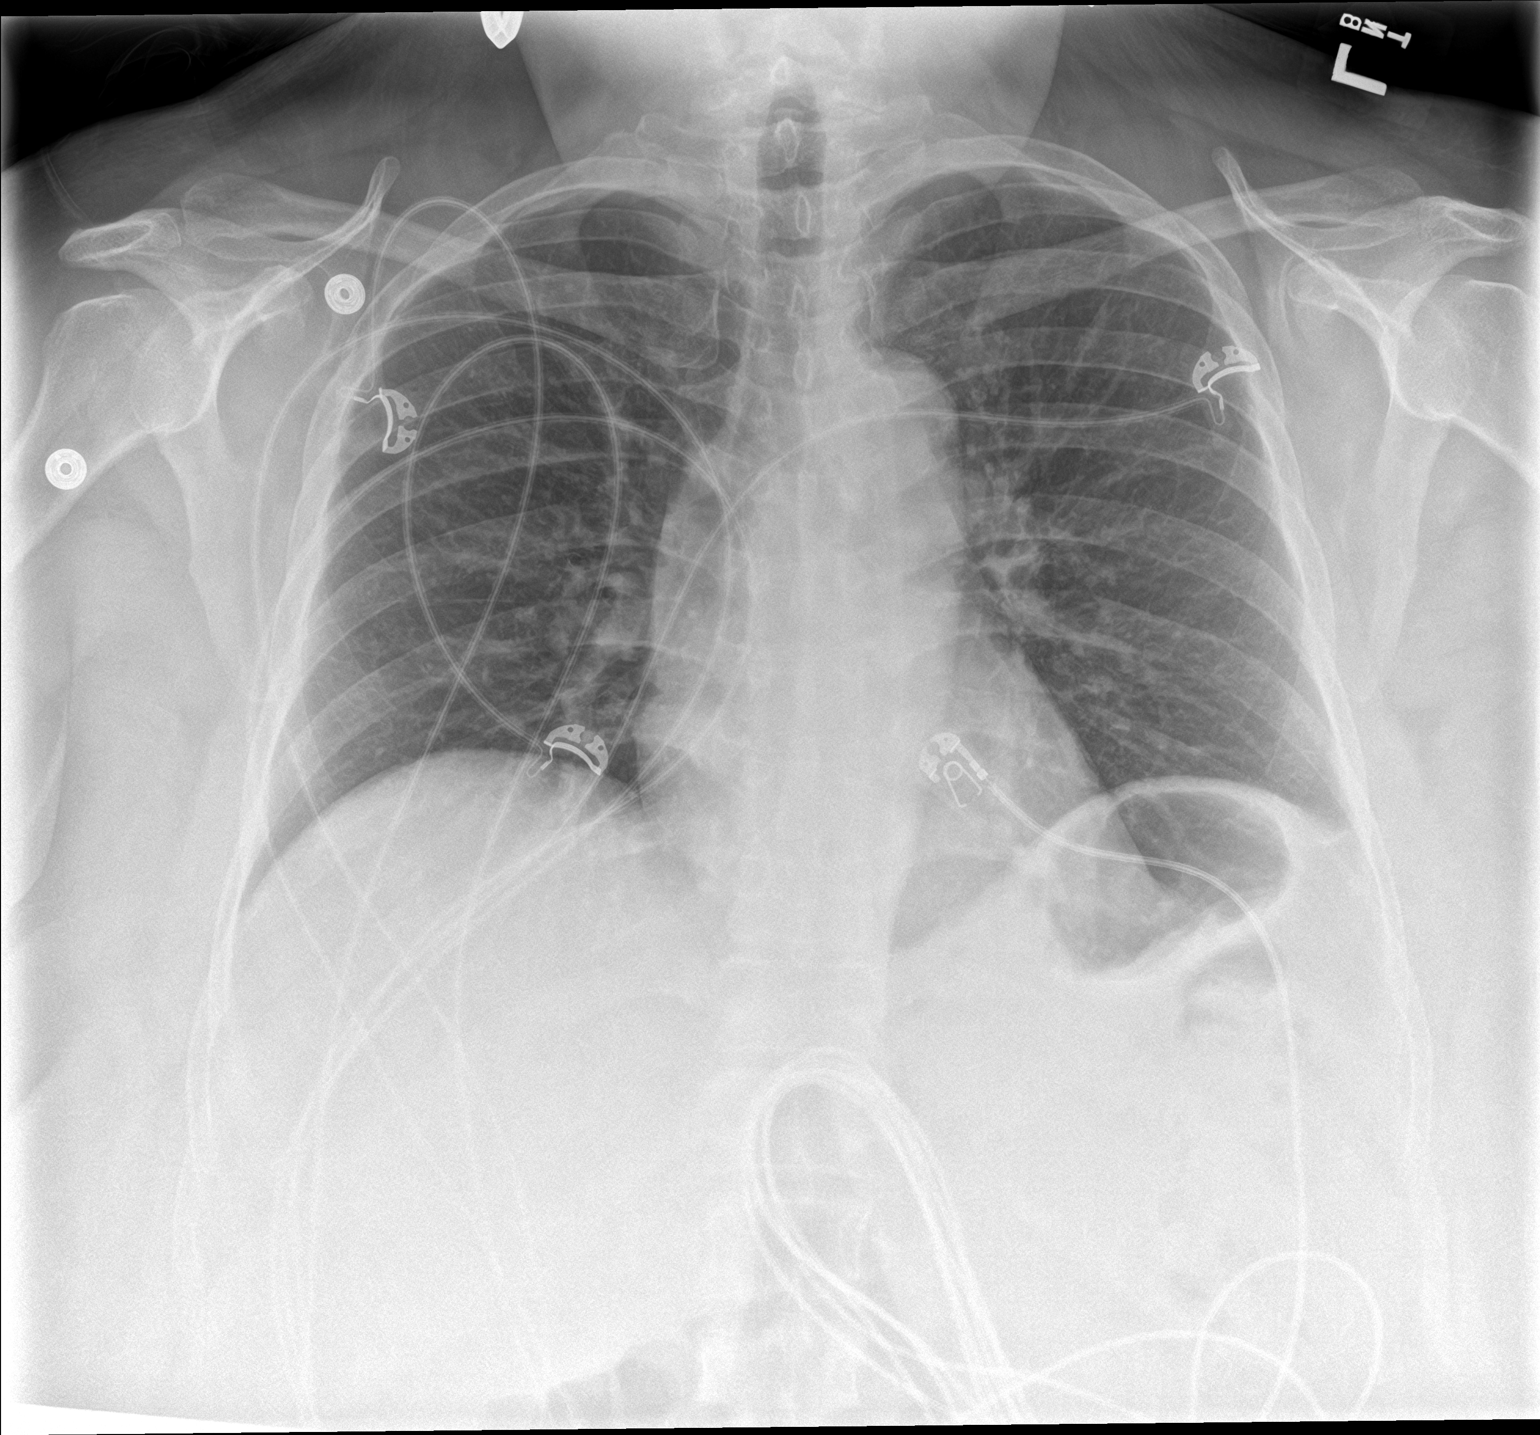

[chest lat]
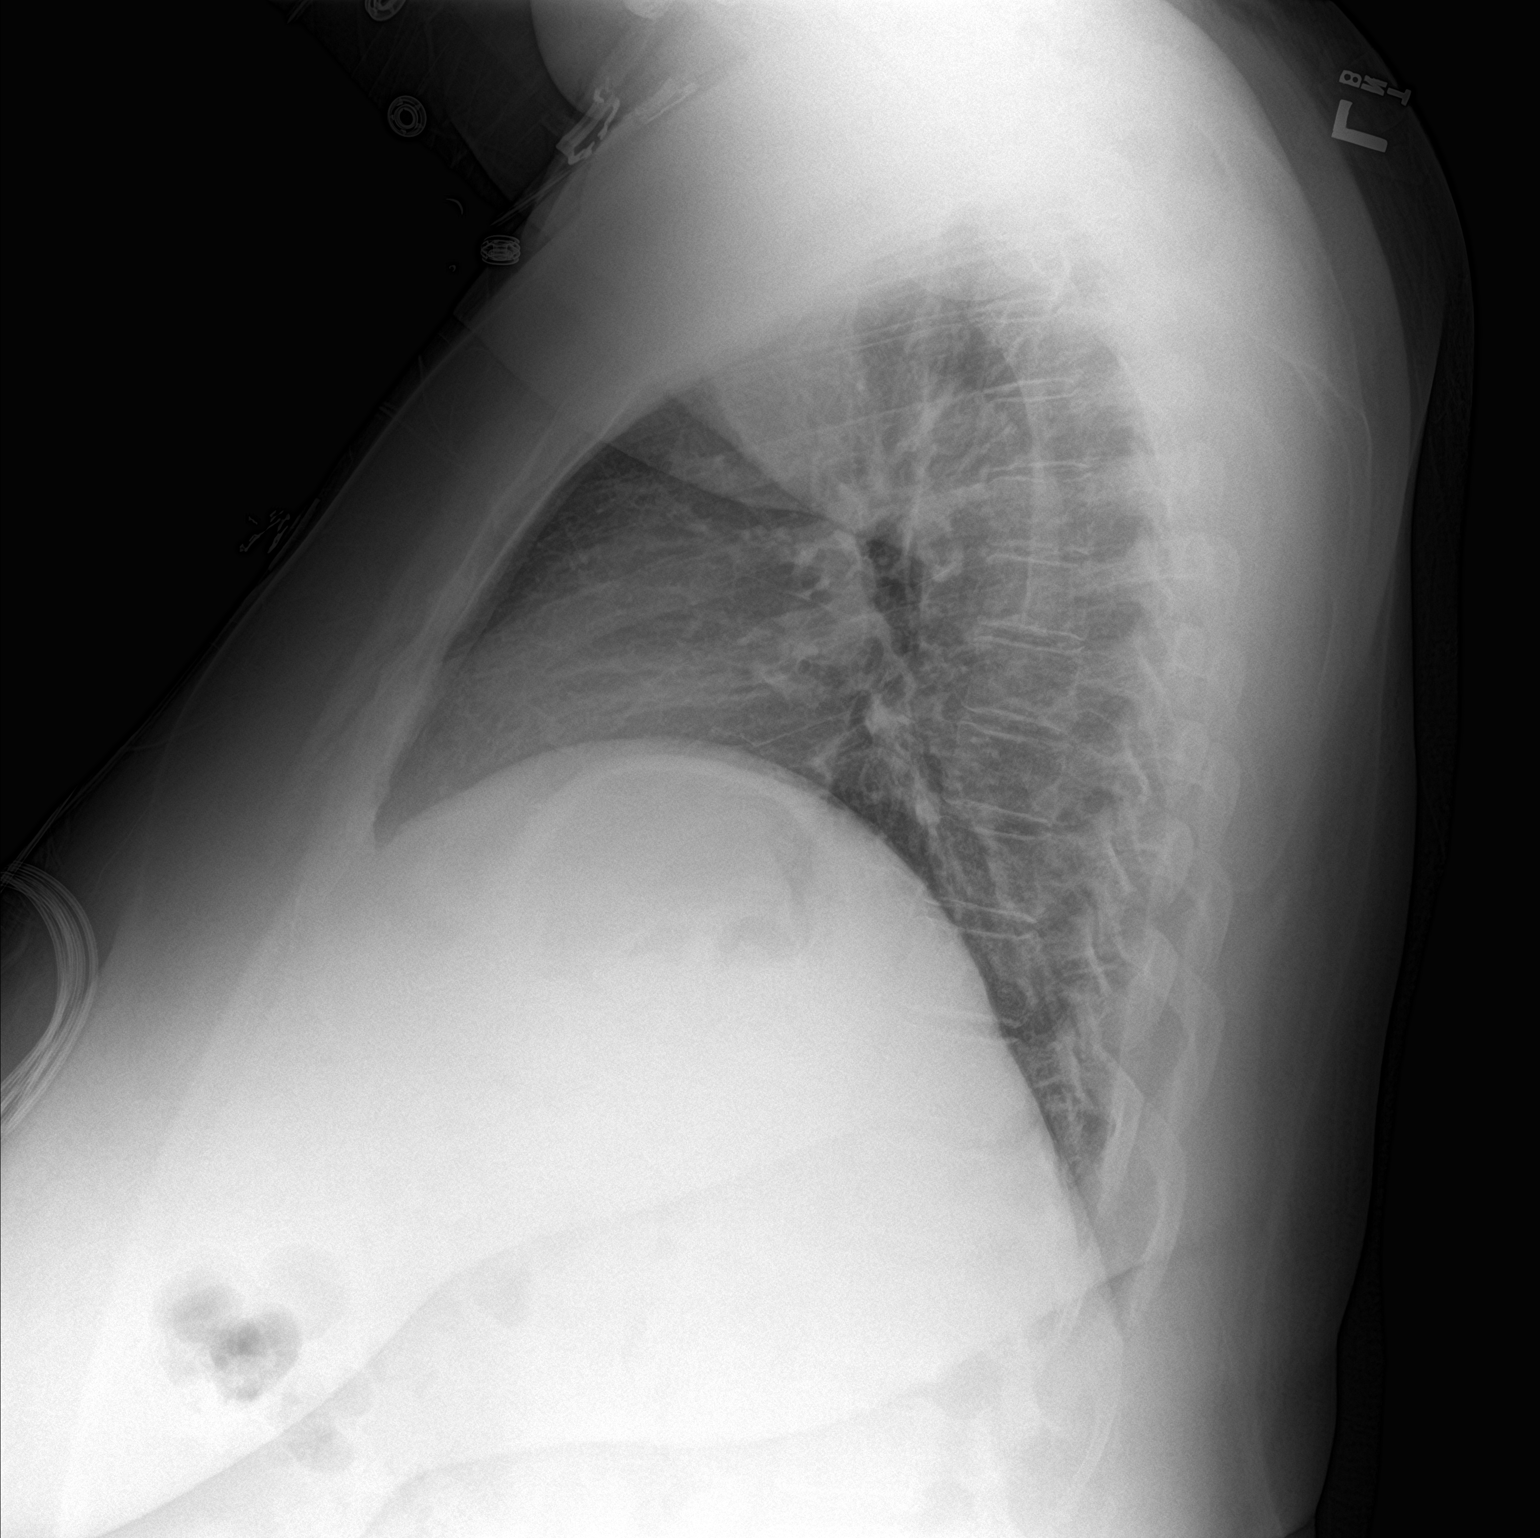

[2 of 2 positions shown; findings below may reference images not displayed]

FINDINGS: The heart size and mediastinal contours are within normal limits.
Both lungs are clear. The visualized skeletal structures are
unremarkable.
IMPRESSION: No active cardiopulmonary disease.

## 2019-02-12 NOTE — Telephone Encounter (Signed)
Pt seen 08/08/18.

## 2019-02-20 ENCOUNTER — Telehealth: Payer: Self-pay | Admitting: Family Medicine

## 2019-02-20 DIAGNOSIS — E7800 Pure hypercholesterolemia, unspecified: Secondary | ICD-10-CM

## 2019-02-20 DIAGNOSIS — E538 Deficiency of other specified B group vitamins: Secondary | ICD-10-CM

## 2019-02-20 DIAGNOSIS — E559 Vitamin D deficiency, unspecified: Secondary | ICD-10-CM

## 2019-02-20 DIAGNOSIS — E119 Type 2 diabetes mellitus without complications: Secondary | ICD-10-CM

## 2019-02-20 NOTE — Telephone Encounter (Signed)
-----   Message from Ellamae Sia sent at 02/14/2019  4:05 PM EDT ----- Regarding: lab orders for Wednesdauy, 7.1,20 Patient is scheduled for CPX labs, please order future labs, Thanks , Karna Christmas

## 2019-02-21 ENCOUNTER — Other Ambulatory Visit: Payer: Medicaid Other

## 2019-02-27 ENCOUNTER — Encounter: Payer: Medicaid Other | Admitting: Family Medicine

## 2019-03-31 ENCOUNTER — Other Ambulatory Visit: Payer: Self-pay | Admitting: Radiology

## 2019-03-31 DIAGNOSIS — Z20822 Contact with and (suspected) exposure to covid-19: Secondary | ICD-10-CM

## 2019-04-01 LAB — NOVEL CORONAVIRUS, NAA: SARS-CoV-2, NAA: NOT DETECTED

## 2019-05-01 ENCOUNTER — Ambulatory Visit: Payer: Self-pay | Admitting: Family Medicine

## 2019-05-01 ENCOUNTER — Other Ambulatory Visit: Payer: Self-pay

## 2019-05-01 ENCOUNTER — Encounter: Payer: Self-pay | Admitting: Family Medicine

## 2019-05-01 ENCOUNTER — Other Ambulatory Visit (INDEPENDENT_AMBULATORY_CARE_PROVIDER_SITE_OTHER): Payer: Self-pay

## 2019-05-01 VITALS — BP 142/98 | HR 97 | Temp 98.5°F | Ht 65.0 in | Wt 301.8 lb

## 2019-05-01 DIAGNOSIS — E538 Deficiency of other specified B group vitamins: Secondary | ICD-10-CM

## 2019-05-01 DIAGNOSIS — E119 Type 2 diabetes mellitus without complications: Secondary | ICD-10-CM

## 2019-05-01 DIAGNOSIS — E559 Vitamin D deficiency, unspecified: Secondary | ICD-10-CM

## 2019-05-01 DIAGNOSIS — I10 Essential (primary) hypertension: Secondary | ICD-10-CM

## 2019-05-01 DIAGNOSIS — Z23 Encounter for immunization: Secondary | ICD-10-CM

## 2019-05-01 DIAGNOSIS — E7800 Pure hypercholesterolemia, unspecified: Secondary | ICD-10-CM

## 2019-05-01 DIAGNOSIS — Z0000 Encounter for general adult medical examination without abnormal findings: Secondary | ICD-10-CM

## 2019-05-01 LAB — COMPREHENSIVE METABOLIC PANEL
ALT: 20 U/L (ref 0–35)
AST: 13 U/L (ref 0–37)
Albumin: 3.9 g/dL (ref 3.5–5.2)
Alkaline Phosphatase: 58 U/L (ref 39–117)
BUN: 9 mg/dL (ref 6–23)
CO2: 29 mEq/L (ref 19–32)
Calcium: 9.4 mg/dL (ref 8.4–10.5)
Chloride: 102 mEq/L (ref 96–112)
Creatinine, Ser: 0.74 mg/dL (ref 0.40–1.20)
GFR: 99.97 mL/min (ref >60.00)
Glucose, Bld: 236 mg/dL — ABNORMAL HIGH (ref 70–99)
Potassium: 4.4 mEq/L (ref 3.5–5.1)
Sodium: 139 mEq/L (ref 135–145)
Total Bilirubin: 0.3 mg/dL (ref 0.2–1.2)
Total Protein: 7 g/dL (ref 6.0–8.3)

## 2019-05-01 LAB — VITAMIN D 25 HYDROXY (VIT D DEFICIENCY, FRACTURES): VITD: 16.75 ng/mL — ABNORMAL LOW (ref 30.00–100.00)

## 2019-05-01 LAB — VITAMIN B12: Vitamin B-12: 147 pg/mL — ABNORMAL LOW (ref 211–911)

## 2019-05-01 LAB — LIPID PANEL
Cholesterol: 155 mg/dL (ref 0–200)
HDL: 36.8 mg/dL — ABNORMAL LOW (ref >39.00)
LDL Cholesterol: 103 mg/dL — ABNORMAL HIGH (ref 0–99)
NonHDL: 118.37
Total CHOL/HDL Ratio: 4
Triglycerides: 76 mg/dL (ref 0.0–149.0)
VLDL: 15.2 mg/dL (ref 0.0–40.0)

## 2019-05-01 LAB — HEMOGLOBIN A1C: Hgb A1c MFr Bld: 9.6 % — ABNORMAL HIGH (ref 4.6–6.5)

## 2019-05-01 LAB — HM DIABETES FOOT EXAM

## 2019-05-01 MED ORDER — CHOLECALCIFEROL 1.25 MG (50000 UT) PO TABS: ORAL_TABLET | ORAL | 0 refills | Status: DC

## 2019-05-01 MED ORDER — CYANOCOBALAMIN 1000 MCG/ML IJ SOLN
1000.0000 ug | Freq: Once | INTRAMUSCULAR | Status: AC
  Administered 2019-05-01: 1000 ug via INTRAMUSCULAR

## 2019-05-01 NOTE — Progress Notes (Signed)
No critical labs need to be addressed urgently. We will discuss labs in detail at upcoming office visit.   

## 2019-05-01 NOTE — Addendum Note (Signed)
Addended by: Carter Kitten on: 05/01/2019 04:52 PM   Modules accepted: Orders

## 2019-05-01 NOTE — Assessment & Plan Note (Signed)
Poor control.. she wishes to be agfgressive with diet.. cannot afford any med like victoza ) no insurance)

## 2019-05-01 NOTE — Assessment & Plan Note (Signed)
Well controlled. Continue current medication.  crestor

## 2019-05-01 NOTE — Assessment & Plan Note (Signed)
Encouraged exercise, weight loss, healthy eating habits. ? ?

## 2019-05-01 NOTE — Assessment & Plan Note (Addendum)
Persistently elevated despite 3 max meds. Refuses additional med at this time. Needs to lose weight.. Continue current medication.

## 2019-05-01 NOTE — Patient Instructions (Addendum)
Work on low carb diet, increase exercise and weight loss. Please schedule mammogram on your own.  Call if interested in colon cancer screening.  Start zyrtec at bedtime.  Can try glucosamine 500 mg Three ties a day as needed for arthriitis.Marland Kitchen or ibuprofen/aleve.

## 2019-05-01 NOTE — Assessment & Plan Note (Signed)
-  replete °

## 2019-05-01 NOTE — Progress Notes (Signed)
Chief Complaint  Patient presents with  . Annual Exam    History of Present Illness: HPI   The patient is here for annual wellness exam and preventative care.      Woke up this AM with  Mild ST.. no mucus, no congestion, no sinus pain, no SOB, no fever. No PND.Marland Kitchen occ allergies this time of year.  She has been at home quarantining from work given work out.  Coronavirus test neg 03/31/2019. HAs been around dust lately.  She has been out of work for a month given covid outbreak. She has been eating poorly given at home. Hypertension:   Inadequate control in office on amlodipine, HCTZ and losartan.. all at max  BP Readings from Last 3 Encounters:  05/01/19 (!) 142/98  11/08/18 (!) 148/96  09/29/18 (!) 154/94  Using medication without problems or lightheadedness: none Chest pain with exertion:none Edema:none Short of breath:none Average home BPs: 140/90s Other issues: Wt Readings from Last 3 Encounters:  05/01/19 (!) 301 lb 12 oz (136.9 kg)  11/08/18 298 lb (135.2 kg)  09/29/18 295 lb 8 oz (134 kg)     Elevated Cholesterol:  LDL almost at goal < 100 on crestor Lab Results  Component Value Date   CHOL 155 05/01/2019   HDL 36.80 (L) 05/01/2019   LDLCALC 103 (H) 05/01/2019   TRIG 76.0 05/01/2019   CHOLHDL 4 05/01/2019  Using medications without problems:none Muscle aches: none Diet compliance: poor diet Exercise: none Other complaints:  Diabetes:   Poor control on  Metformin and glipizide max Lab Results  Component Value Date   HGBA1C 9.6 (H) 05/01/2019  Using medications without difficulties: Hypoglycemic episodes:none Hyperglycemic episodes: yes Feet problems:no ulcers Blood Sugars averaging:  FBS 179-210 eye exam within last year: due  Low vit D and low B12: has not had B12  In j for a long time. She has been taking low vit D 500 IU  Daily.   No insurance   COVID 19 screen No recent travel or known exposure to [place] The patient denies respiratory  symptoms of COVID 19 at this time.  The importance of social distancing was discussed today.   Review of Systems  Constitutional: Negative for chills and fever.  HENT: Negative for congestion and ear pain.   Eyes: Negative for pain and redness.  Respiratory: Negative for cough and shortness of breath.   Cardiovascular: Negative for chest pain, palpitations and leg swelling.  Gastrointestinal: Negative for abdominal pain, blood in stool, constipation, diarrhea, nausea and vomiting.  Genitourinary: Negative for dysuria.  Musculoskeletal: Negative for falls and myalgias.  Skin: Negative for rash.  Neurological: Negative for dizziness.  Psychiatric/Behavioral: Negative for depression. The patient is not nervous/anxious.       Past Medical History:  Diagnosis Date  . Anemia 2008   iron deficiency  . Diabetes mellitus    on watch,not on meds  . History of blood transfusion 02/18/12   received 2 uprbc for hgb 6.5  . History of blood transfusion   . Hyperlipidemia   . Hypertension   . 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 Marcy Panning @ [facility]    reports that she has never smoked. She has never used smokeless tobacco. She reports current alcohol use. She reports that she does not use drugs.   Current Outpatient Medications:  .  amLODipine (NORVASC) 10 MG tablet, Take 1 tablet (10 mg total) by mouth daily., Disp: 90 tablet, Rfl: 3 .  aspirin 81 MG tablet, Take 81 mg by mouth daily., Disp: , Rfl:  .  glimepiride (AMARYL) 4 MG tablet, TAKE 2 TABLETS BY MOUTH ONCE DAILY WITH BREAKFAST, Disp: 180 tablet, Rfl: 0 .  hydrochlorothiazide (HYDRODIURIL) 25 MG tablet, Take 1 tablet (25 mg total) by mouth daily., Disp: 90 tablet, Rfl: 3 .  ibuprofen (ADVIL,MOTRIN) 800 MG tablet, Take 1 tablet (800 mg total) by mouth every 8 (eight) hours as needed for moderate pain., Disp: 21 tablet, Rfl: 0 .  losartan (COZAAR) 100 MG tablet, Take 1 tablet (100 mg total) by mouth  daily., Disp: 90 tablet, Rfl: 3 .  meloxicam (MOBIC) 15 MG tablet, Take 1 tablet (15 mg total) by mouth daily., Disp: 30 tablet, Rfl: 3 .  metFORMIN (GLUCOPHAGE-XR) 500 MG 24 hr tablet, Take 4 tablets (2,000 mg total) by mouth daily with breakfast., Disp: 360 tablet, Rfl: 1 .  ondansetron (ZOFRAN ODT) 4 MG disintegrating tablet, Take 1 tablet (4 mg total) by mouth every 8 (eight) hours as needed for nausea or vomiting., Disp: 20 tablet, Rfl: 0 .  rosuvastatin (CRESTOR) 40 MG tablet, Take 1 tablet (40 mg total) by mouth daily., Disp: 90 tablet, Rfl: 3   Observations/Objective: Blood pressure (!) 142/98, pulse 97, temperature 98.5 F (36.9 C), temperature source Temporal, height 5\' 5"  (1.651 m), weight (!) 301 lb 12 oz (136.9 kg), last menstrual period 02/07/2012, SpO2 95 %.  Physical Exam Constitutional:      General: She is not in acute distress.    Appearance: Normal appearance. She is well-developed. She is obese. She is not ill-appearing or toxic-appearing.  HENT:     Head: Normocephalic.     Right Ear: Hearing, tympanic membrane, ear canal and external ear normal.     Left Ear: Hearing, tympanic membrane, ear canal and external ear normal.     Nose: Nose normal.     Mouth/Throat:     Pharynx: Oropharynx is clear. Uvula midline. No oropharyngeal exudate or posterior oropharyngeal erythema.     Tonsils: No tonsillar exudate or tonsillar abscesses.  Eyes:     General: Lids are normal. Lids are everted, no foreign bodies appreciated.     Conjunctiva/sclera: Conjunctivae normal.     Pupils: Pupils are equal, round, and reactive to light.  Neck:     Musculoskeletal: Normal range of motion and neck supple.     Thyroid: No thyroid mass or thyromegaly.     Vascular: No carotid bruit.     Trachea: Trachea normal.  Cardiovascular:     Rate and Rhythm: Normal rate and regular rhythm.     Heart sounds: Normal heart sounds, S1 normal and S2 normal. No murmur. No gallop.   Pulmonary:      Effort: Pulmonary effort is normal. No respiratory distress.     Breath sounds: Normal breath sounds. No wheezing, rhonchi or rales.  Abdominal:     General: Bowel sounds are normal. There is no distension or abdominal bruit.     Palpations: Abdomen is soft. There is no fluid wave or mass.     Tenderness: There is no abdominal tenderness. There is no guarding or rebound.     Hernia: No hernia is present.  Lymphadenopathy:     Cervical: No cervical adenopathy.  Skin:    General: Skin is warm and dry.     Findings: No rash.  Neurological:     Mental Status: She is alert.     Cranial Nerves: No cranial nerve deficit.  Sensory: No sensory deficit.  Psychiatric:        Mood and Affect: Mood is not anxious or depressed.        Speech: Speech normal.        Behavior: Behavior normal. Behavior is cooperative.        Judgment: Judgment normal.     Diabetic foot exam: Normal inspection No skin breakdown No calluses  Normal DP pulses Normal sensation to light touch and monofilament Nails normal  Assessment and Plan   The patient's preventative maintenance and recommended screening tests for an annual wellness exam were reviewed in full today. Brought up to date unless services declined.  Counselled on the importance of diet, exercise, and its role in overall health and mortality. The patient's FH and SH was reviewed, including their home life, tobacco status, and drug and alcohol status.   Vaccines; uptodate, flu shot given today PAP/DVE:  nml pap 2016, HPV, next pap due in 5 year, no indication for  DVE. Mammo: overdue. plans on scheduling. HIV screen: refused. Colon ca screening: due for colonoscopy or ifob.. refused for now.  nonsmoker  ETOH: rare wine  Morbid obesity ([place]) Encouraged exercise, weight loss, healthy eating habits.   Vitamin D deficiency replete  Diabetes mellitus with no complication, poor control. Poor control.. she wishes to be agfgressive with  diet.. cannot afford any med like victoza ) no insurance)  High cholesterol Well controlled. Continue current medication.  crestor  Benign hypertension Persistently elevated despite 3 max meds. Refuses additional med at this time. Needs to lose weight.. Continue current medication.      Eliezer Lofts, MD

## 2019-05-22 ENCOUNTER — Other Ambulatory Visit: Payer: Self-pay | Admitting: *Deleted

## 2019-05-22 MED ORDER — VITAMIN D3 1.25 MG (50000 UT) PO CAPS: 1.0000 | ORAL_CAPSULE | ORAL | 0 refills | Status: DC

## 2019-05-22 NOTE — Telephone Encounter (Signed)
Received fax from [facility] asking for new prescription for Vit D 3 50,000 units in capsules instead of tablets.  Rx sent as requested.

## 2019-06-25 ENCOUNTER — Other Ambulatory Visit: Payer: Self-pay

## 2019-06-25 ENCOUNTER — Other Ambulatory Visit: Payer: Self-pay | Admitting: Family Medicine

## 2019-06-25 MED ORDER — AMLODIPINE BESYLATE 10 MG PO TABS: 10.0000 mg | ORAL_TABLET | Freq: Every day | ORAL | 1 refills | Status: DC

## 2019-08-18 IMAGING — DX DG CHEST 2V
2 series · 2 of 2 positions shown · non-contrast
Comparison: 01/26/2018

CLINICAL DATA: Coughing for 2 weeks.

EXAM:
CHEST - 2 VIEW

[chest pa]
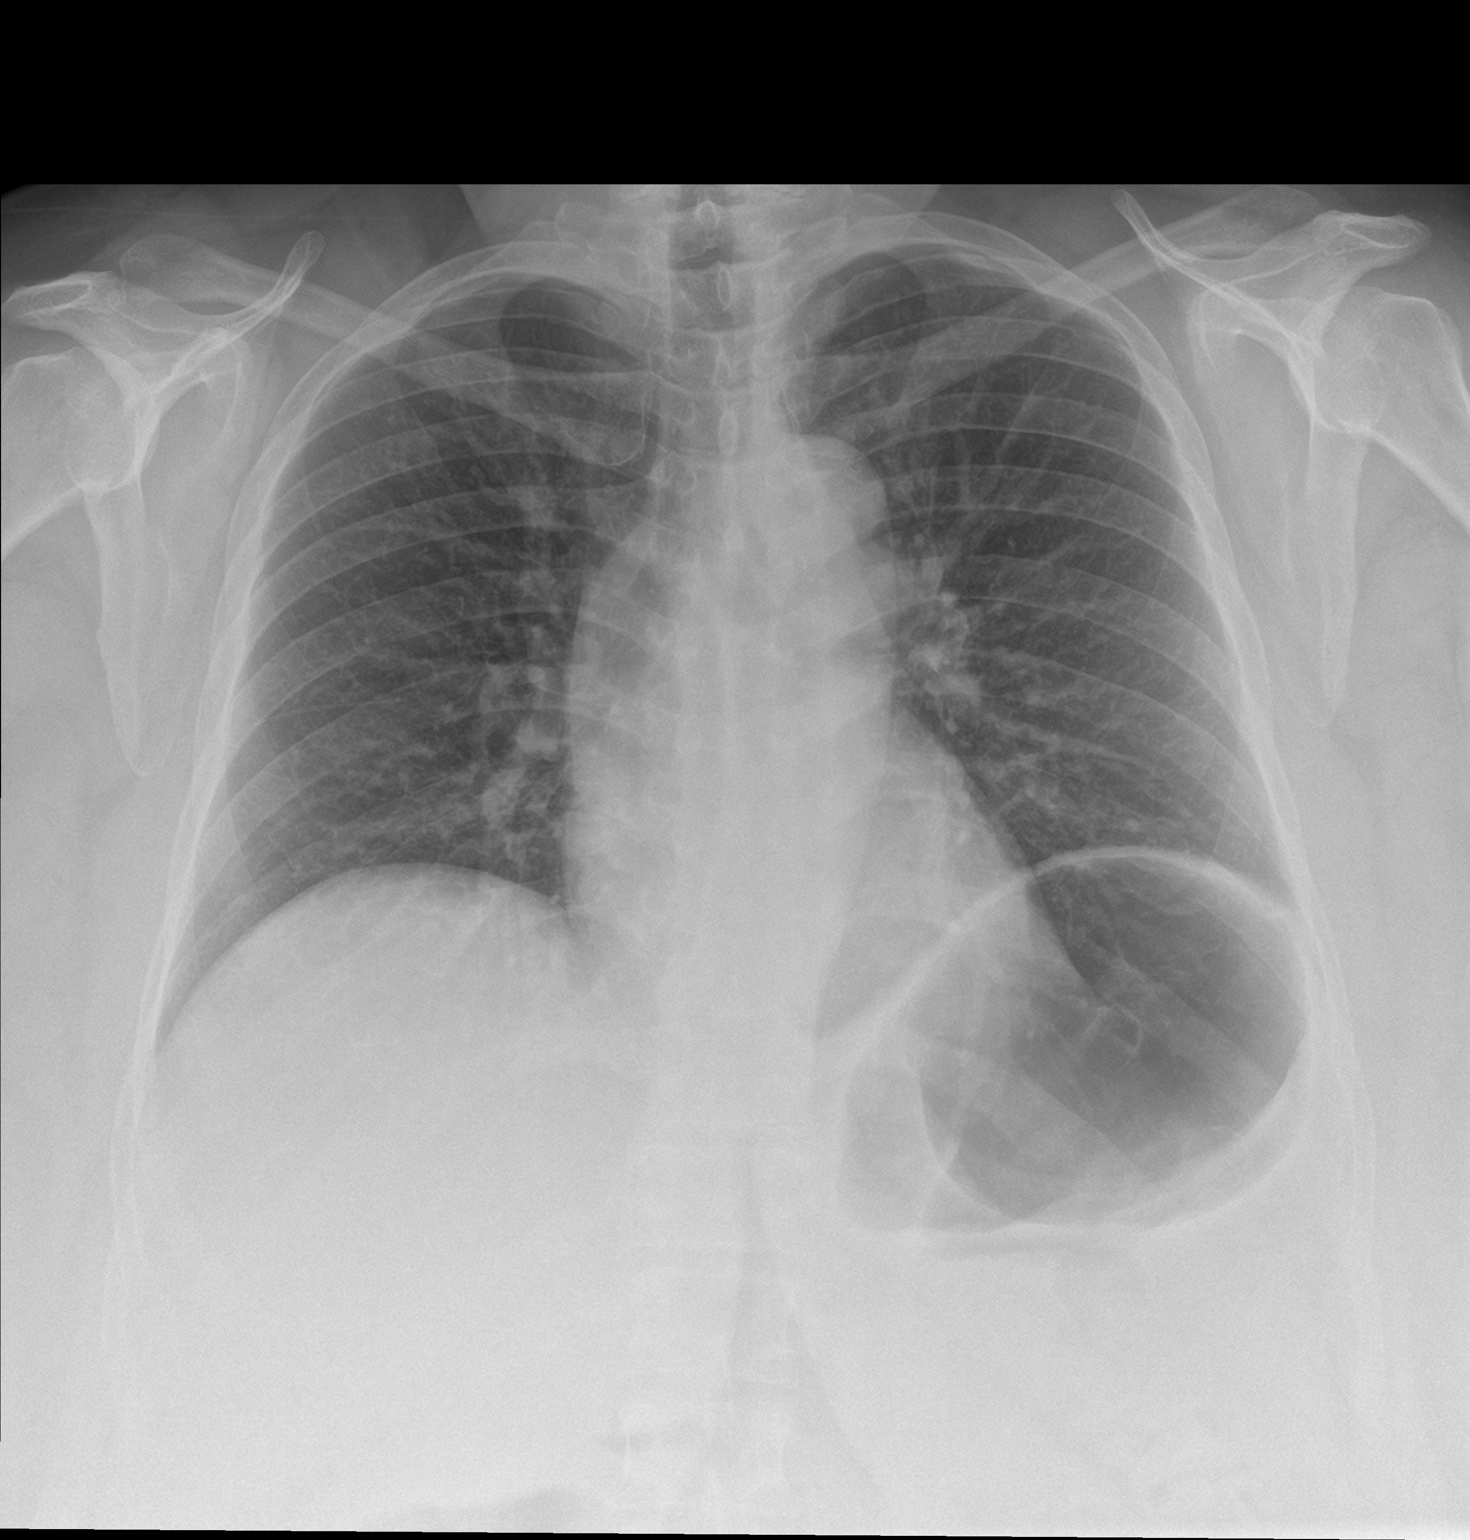

[chest lat]
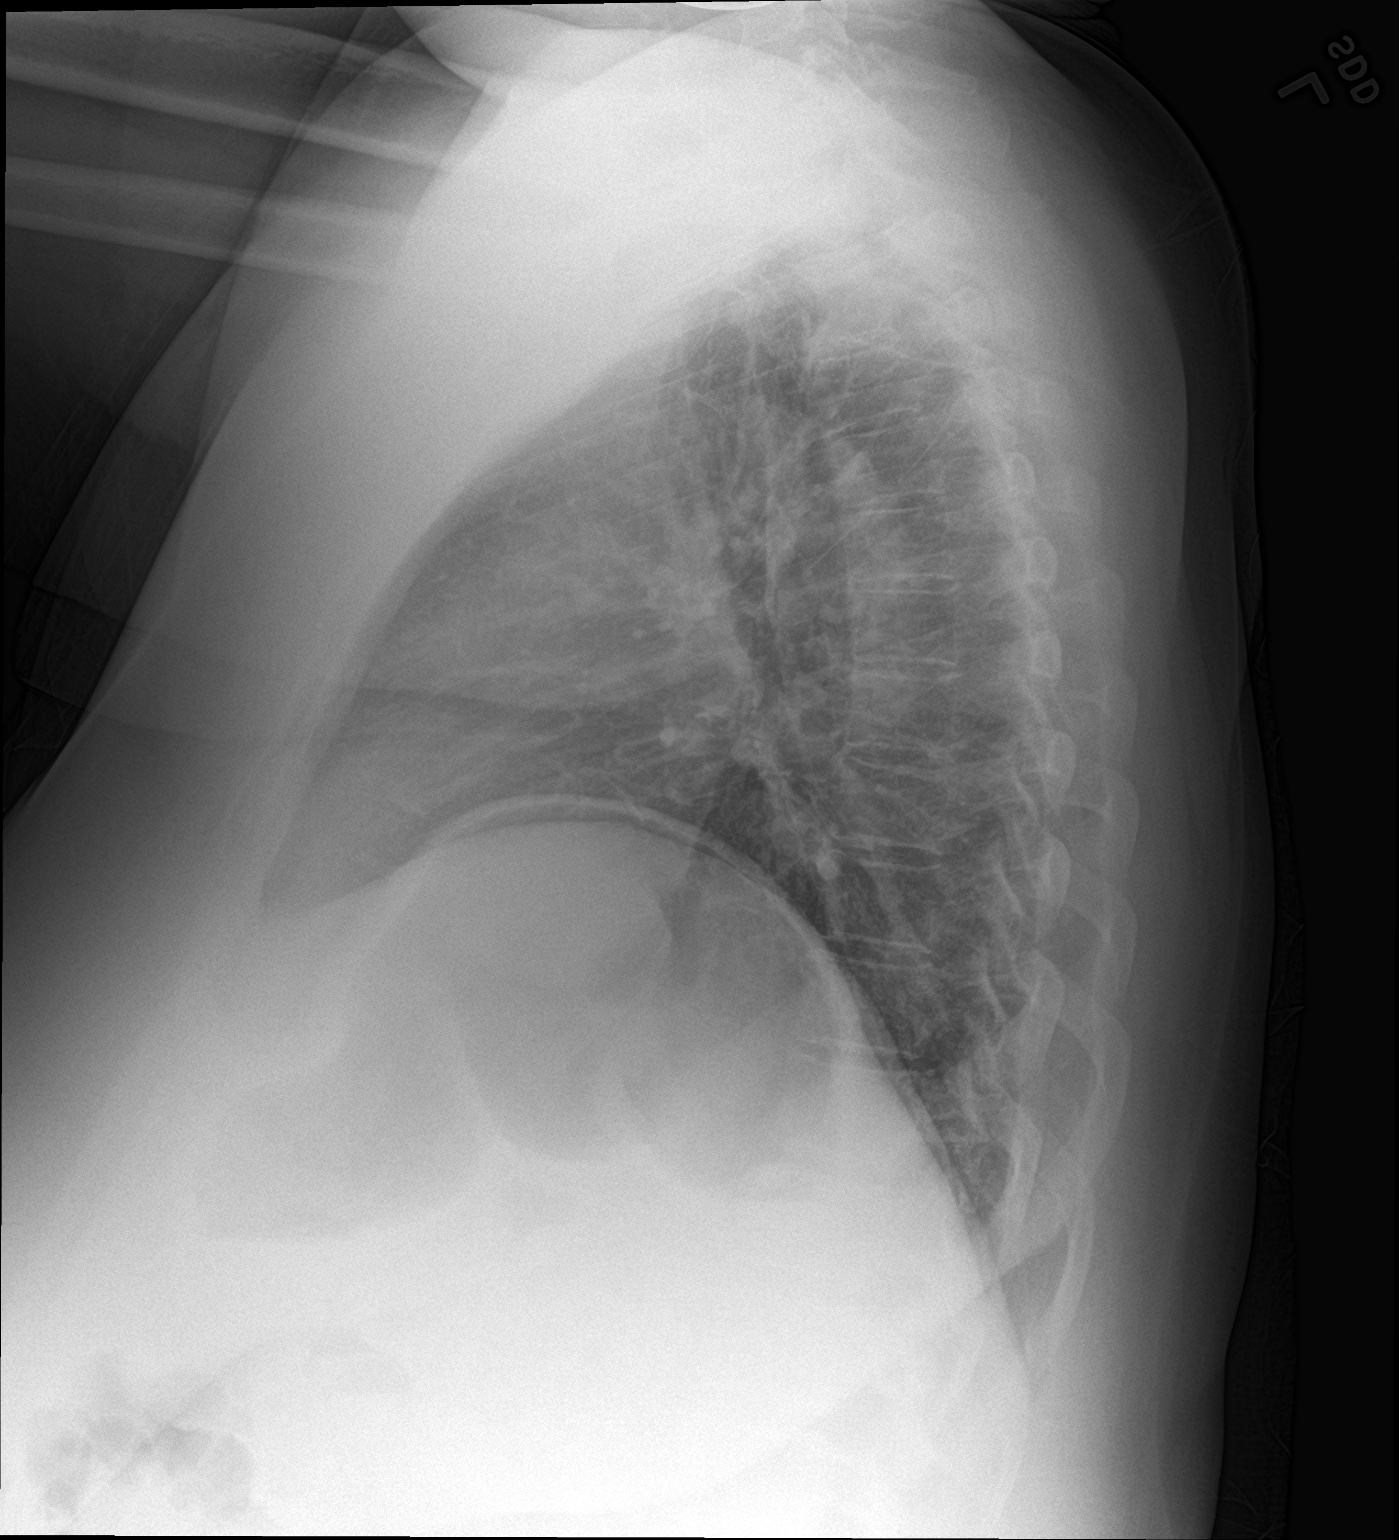

[2 of 2 positions shown; findings below may reference images not displayed]

FINDINGS: The heart size and mediastinal contours are within normal limits.
Both lungs are clear. No pleural effusion or pneumothorax. The
visualized skeletal structures are unremarkable.
IMPRESSION: No active cardiopulmonary disease.

## 2020-03-09 ENCOUNTER — Other Ambulatory Visit: Payer: Self-pay | Admitting: Family Medicine

## 2020-03-10 MED ORDER — HYDROCHLOROTHIAZIDE 25 MG PO TABS: 25.0000 mg | ORAL_TABLET | Freq: Every day | ORAL | 0 refills | Status: DC

## 2020-03-10 MED ORDER — LOSARTAN POTASSIUM 100 MG PO TABS: 100.0000 mg | ORAL_TABLET | Freq: Every day | ORAL | 0 refills | Status: DC

## 2020-03-10 MED ORDER — ROSUVASTATIN CALCIUM 40 MG PO TABS: 40.0000 mg | ORAL_TABLET | Freq: Every day | ORAL | 0 refills | Status: DC

## 2020-04-01 ENCOUNTER — Encounter: Payer: Self-pay | Admitting: Family Medicine

## 2020-04-01 ENCOUNTER — Other Ambulatory Visit: Payer: Self-pay

## 2020-04-01 ENCOUNTER — Ambulatory Visit (INDEPENDENT_AMBULATORY_CARE_PROVIDER_SITE_OTHER): Payer: Self-pay | Admitting: Family Medicine

## 2020-04-01 VITALS — BP 140/80 | HR 104 | Temp 98.0°F | Ht 65.0 in | Wt 295.2 lb

## 2020-04-01 DIAGNOSIS — E785 Hyperlipidemia, unspecified: Secondary | ICD-10-CM

## 2020-04-01 DIAGNOSIS — I10 Essential (primary) hypertension: Secondary | ICD-10-CM

## 2020-04-01 DIAGNOSIS — E1169 Type 2 diabetes mellitus with other specified complication: Secondary | ICD-10-CM

## 2020-04-01 DIAGNOSIS — E119 Type 2 diabetes mellitus without complications: Secondary | ICD-10-CM

## 2020-04-01 DIAGNOSIS — E1159 Type 2 diabetes mellitus with other circulatory complications: Secondary | ICD-10-CM

## 2020-04-01 DIAGNOSIS — I152 Hypertension secondary to endocrine disorders: Secondary | ICD-10-CM

## 2020-04-01 LAB — POCT GLYCOSYLATED HEMOGLOBIN (HGB A1C): Hemoglobin A1C: 9.1 % — AB (ref 4.0–5.6)

## 2020-04-01 MED ORDER — PIOGLITAZONE HCL 30 MG PO TABS: 30.0000 mg | ORAL_TABLET | Freq: Every day | ORAL | 11 refills | Status: DC

## 2020-04-01 NOTE — Assessment & Plan Note (Signed)
Due for re-eval. Eval prior to next OV.

## 2020-04-01 NOTE — Patient Instructions (Signed)
[place] of Actos 30 mg in addition to metformin and glipizide you are already on.  Keep up the great work with healthy eating and regular exercise. The pharmacist will contact you in the next few weeks about medication assistance.

## 2020-04-01 NOTE — Assessment & Plan Note (Signed)
Encouraged exercise, weight loss, healthy eating habits. ? ?

## 2020-04-01 NOTE — Assessment & Plan Note (Signed)
Add Actos as cannot afford SGLT2i or GLP1. Will refer to [facility] pharmacy for medication assistance consult.

## 2020-04-01 NOTE — Progress Notes (Signed)
Chief Complaint  Patient presents with  . Diabetes    History of Present Illness: HPI  54 year old female presetns for DM  follow up.   She is overdue for lab eval of cholesterol as well.  Diabetes: Slight improvement in A1C down to 9.1 from 9.6 on 04/2020.  On metformin and glimepiride max. Using medications without difficulties: Hypoglycemic episodes: Hyperglycemic episodes: Feet problems: no ulcers Blood Sugars averaging: eye exam within last year:  Hypertension:    Borderline control on  Losartan, amlodipine and HCTZ. BP Readings from Last 3 Encounters:  04/01/20 140/80  05/01/19 (!) 142/98  11/08/18 (!) 148/96  Using medication without problems or lightheadedness: none Chest pain with exertion:none Edema:none Short of breath:ne Average home BPs: Other issues: Morbid obesity  Wt Readings from Last 3 Encounters:  04/01/20 295 lb 4 oz (133.9 kg)  05/01/19 (!) 301 lb 12 oz (136.9 kg)  11/08/18 298 lb (135.2 kg)  Body mass index is 49.13 kg/m.   Elevated Cholesterol: On crestor.. due for lab re-eval. Using medications without problems: Muscle aches:  Diet compliance: improve portion size, less sweets. Exercise: occ Other complaints:    This visit occurred during the SARS-CoV-2 public health emergency.  Safety protocols were in place, including screening questions prior to the visit, additional usage of staff PPE, and extensive cleaning of exam room while observing appropriate contact time as indicated for disinfecting solutions.   COVID 19 screen:  No recent travel or known exposure to COVID19 The patient denies respiratory symptoms of COVID 19 at this time. The importance of social distancing was discussed today.     ROS    Past Medical History:  Diagnosis Date  . Anemia 2008   iron deficiency  . Diabetes mellitus    on watch,not on meds  . History of blood transfusion 02/18/12   received 2 uprbc for hgb 6.5  . History of blood transfusion   .  Hyperlipidemia   . Hypertension   . 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 Marcy Panning @ [place]    reports that she has never smoked. She has never used smokeless tobacco. She reports current alcohol use. She reports that she does not use drugs.   Current Outpatient Medications:  .  amLODipine (NORVASC) 10 MG tablet, Take 1 tablet by mouth once daily, Disp: 30 tablet, Rfl: 0 .  aspirin 81 MG tablet, Take 81 mg by mouth daily., Disp: , Rfl:  .  Cholecalciferol (VITAMIN D3) 1.25 MG (50000 UT) CAPS, Take 1 capsule by mouth every 7 (seven) days., Disp: 12 capsule, Rfl: 0 .  glimepiride (AMARYL) 4 MG tablet, TAKE 2 TABLETS BY MOUTH ONCE DAILY WITH BREAKFAST, Disp: 60 tablet, Rfl: 0 .  hydrochlorothiazide (HYDRODIURIL) 25 MG tablet, Take 1 tablet (25 mg total) by mouth daily., Disp: 30 tablet, Rfl: 0 .  ibuprofen (ADVIL,MOTRIN) 800 MG tablet, Take 1 tablet (800 mg total) by mouth every 8 (eight) hours as needed for moderate pain., Disp: 21 tablet, Rfl: 0 .  losartan (COZAAR) 100 MG tablet, Take 1 tablet (100 mg total) by mouth daily., Disp: 30 tablet, Rfl: 0 .  meloxicam (MOBIC) 15 MG tablet, Take 1 tablet (15 mg total) by mouth daily., Disp: 30 tablet, Rfl: 3 .  metFORMIN (GLUCOPHAGE-XR) 500 MG 24 hr tablet, TAKE 4 TABLETS BY MOUTH ONCE DAILY WITH BREAKFAST, Disp: 120 tablet, Rfl: 0 .  ondansetron (ZOFRAN ODT) 4 MG disintegrating tablet, Take 1 tablet (  4 mg total) by mouth every 8 (eight) hours as needed for nausea or vomiting., Disp: 20 tablet, Rfl: 0 .  rosuvastatin (CRESTOR) 40 MG tablet, Take 1 tablet (40 mg total) by mouth daily., Disp: 30 tablet, Rfl: 0   Observations/Objective: Blood pressure 140/80, pulse (!) 104, temperature 98 F (36.7 C), temperature source Temporal, height 5\' 5"  (1.651 m), weight 295 lb 4 oz (133.9 kg), last menstrual period 02/07/2012, SpO2 95 %.  Physical Exam Constitutional:      General: She is not in acute distress.     Appearance: Normal appearance. She is well-developed. She is not ill-appearing or toxic-appearing.  HENT:     Head: Normocephalic.     Right Ear: Hearing, tympanic membrane, ear canal and external ear normal. Tympanic membrane is not erythematous, retracted or bulging.     Left Ear: Hearing, tympanic membrane, ear canal and external ear normal. Tympanic membrane is not erythematous, retracted or bulging.     Nose: No mucosal edema or rhinorrhea.     Right Sinus: No maxillary sinus tenderness or frontal sinus tenderness.     Left Sinus: No maxillary sinus tenderness or frontal sinus tenderness.     Mouth/Throat:     Pharynx: Uvula midline.  Eyes:     General: Lids are normal. Lids are everted, no foreign bodies appreciated.     Conjunctiva/sclera: Conjunctivae normal.     Pupils: Pupils are equal, round, and reactive to light.  Neck:     Thyroid: No thyroid mass or thyromegaly.     Vascular: No carotid bruit.     Trachea: Trachea normal.  Cardiovascular:     Rate and Rhythm: Normal rate and regular rhythm.     Pulses: Normal pulses.     Heart sounds: Normal heart sounds, S1 normal and S2 normal. No murmur heard.  No friction rub. No gallop.   Pulmonary:     Effort: Pulmonary effort is normal. No tachypnea or respiratory distress.     Breath sounds: Normal breath sounds. No decreased breath sounds, wheezing, rhonchi or rales.  Abdominal:     General: Bowel sounds are normal.     Palpations: Abdomen is soft.     Tenderness: There is no abdominal tenderness.  Musculoskeletal:     Cervical back: Normal range of motion and neck supple.  Skin:    General: Skin is warm and dry.     Findings: No rash.  Neurological:     Mental Status: She is alert.  Psychiatric:        Mood and Affect: Mood is not anxious or depressed.        Speech: Speech normal.        Behavior: Behavior normal. Behavior is cooperative.        Thought Content: Thought content normal.        Judgment: Judgment  normal.      Assessment and Plan Diabetes mellitus with no complication, poor control.  Add Actos as cannot afford SGLT2i or GLP1. Will refer to [facility] pharmacy for medication assistance consult.  Hyperlipidemia associated with type 2 diabetes mellitus ([place]) Due for re-eval. Eval prior to next OV.  Morbid obesity ([place]) Encouraged exercise, weight loss, healthy eating habits.        Eliezer Lofts, MD

## 2020-04-14 ENCOUNTER — Ambulatory Visit: Payer: Medicaid Other | Attending: Internal Medicine

## 2020-04-14 DIAGNOSIS — Z23 Encounter for immunization: Secondary | ICD-10-CM

## 2020-04-14 NOTE — Progress Notes (Signed)
   Covid-19 Vaccination Clinic  Name:  Courtney Cox    MRN: [id number] DOB: Feb 28, 1966  04/14/2020  Courtney Cox was observed post Covid-19 immunization for 15 minutes without incident. She was provided with Vaccine Information Sheet and instruction to access the V-Safe system.   Courtney Cox was instructed to call 911 with any severe reactions post vaccine: Marland Kitchen Difficulty breathing  . Swelling of face and throat  . A fast heartbeat  . A bad rash all over body  . Dizziness and weakness   Immunizations Administered    Name Date Dose VIS Date Route   Pfizer COVID-19 Vaccine 04/14/2020 10:02 AM 0.3 mL 10/17/2018 Intramuscular   Manufacturer: [employer], [employer]   Lot: [id number]   [place]: [address]-1

## 2020-04-30 ENCOUNTER — Other Ambulatory Visit: Payer: Self-pay | Admitting: Family Medicine

## 2020-05-05 ENCOUNTER — Ambulatory Visit: Payer: Medicaid Other

## 2020-05-26 NOTE — Telephone Encounter (Signed)
Dr. Diona Browner - I am following up with the scheduling department about the referral for CCM. [facility] Hydrolog[facility] or [facility] are great options for patients needing help with generic medications.

## 2020-05-28 MED ORDER — LOSARTAN POTASSIUM 100 MG PO TABS: 100.0000 mg | ORAL_TABLET | Freq: Every day | ORAL | 11 refills | Status: DC

## 2020-05-28 MED ORDER — METFORMIN HCL ER 500 MG PO TB24: ORAL_TABLET | ORAL | 11 refills | Status: DC

## 2020-05-28 MED ORDER — PIOGLITAZONE HCL 30 MG PO TABS
30.0000 mg | ORAL_TABLET | Freq: Every day | ORAL | 11 refills | Status: DC
Start: 2020-05-28 — End: 2020-10-20

## 2020-05-28 MED ORDER — GLIMEPIRIDE 4 MG PO TABS: ORAL_TABLET | ORAL | 11 refills | Status: DC

## 2020-05-28 MED ORDER — HYDROCHLOROTHIAZIDE 25 MG PO TABS
25.0000 mg | ORAL_TABLET | Freq: Every day | ORAL | 11 refills | Status: DC
Start: 2020-05-28 — End: 2020-10-20

## 2020-05-28 MED ORDER — AMLODIPINE BESYLATE 10 MG PO TABS: 10.0000 mg | ORAL_TABLET | Freq: Every day | ORAL | 11 refills | Status: DC

## 2020-05-28 MED ORDER — ROSUVASTATIN CALCIUM 40 MG PO TABS
40.0000 mg | ORAL_TABLET | Freq: Every day | ORAL | 1 refills | Status: DC
Start: 2020-05-28 — End: 2020-10-20

## 2020-07-08 ENCOUNTER — Other Ambulatory Visit: Payer: Medicaid Other

## 2020-07-15 ENCOUNTER — Ambulatory Visit: Payer: Medicaid Other

## 2020-07-15 ENCOUNTER — Encounter: Payer: Medicaid Other | Admitting: Family Medicine

## 2020-07-24 ENCOUNTER — Telehealth: Payer: Self-pay | Admitting: Family Medicine

## 2020-07-24 DIAGNOSIS — E119 Type 2 diabetes mellitus without complications: Secondary | ICD-10-CM

## 2020-07-24 DIAGNOSIS — Z1159 Encounter for screening for other viral diseases: Secondary | ICD-10-CM

## 2020-07-24 DIAGNOSIS — E559 Vitamin D deficiency, unspecified: Secondary | ICD-10-CM

## 2020-07-24 DIAGNOSIS — E538 Deficiency of other specified B group vitamins: Secondary | ICD-10-CM

## 2020-07-24 NOTE — Telephone Encounter (Signed)
-----   Message from Ellamae Sia sent at 07/08/2020  2:21 PM EST ----- Regarding: lab orders for Friday, 12.3.21 Patient is scheduled for CPX labs, please order future labs, Thanks , Karna Christmas

## 2020-07-25 ENCOUNTER — Other Ambulatory Visit (INDEPENDENT_AMBULATORY_CARE_PROVIDER_SITE_OTHER): Payer: Self-pay

## 2020-07-25 ENCOUNTER — Other Ambulatory Visit: Payer: Self-pay

## 2020-07-25 DIAGNOSIS — E559 Vitamin D deficiency, unspecified: Secondary | ICD-10-CM

## 2020-07-25 DIAGNOSIS — E119 Type 2 diabetes mellitus without complications: Secondary | ICD-10-CM

## 2020-07-25 DIAGNOSIS — E538 Deficiency of other specified B group vitamins: Secondary | ICD-10-CM

## 2020-07-25 DIAGNOSIS — Z1159 Encounter for screening for other viral diseases: Secondary | ICD-10-CM

## 2020-07-25 LAB — VITAMIN B12: Vitamin B-12: 274 pg/mL (ref 211–911)

## 2020-07-25 LAB — LIPID PANEL
Cholesterol: 204 mg/dL — ABNORMAL HIGH (ref 0–200)
HDL: 37.4 mg/dL — ABNORMAL LOW (ref >39.00)
LDL Cholesterol: 145 mg/dL — ABNORMAL HIGH (ref 0–99)
NonHDL: 167.08
Total CHOL/HDL Ratio: 5
Triglycerides: 111 mg/dL (ref 0.0–149.0)
VLDL: 22.2 mg/dL (ref 0.0–40.0)

## 2020-07-25 LAB — COMPREHENSIVE METABOLIC PANEL
ALT: 29 U/L (ref 0–35)
AST: 21 U/L (ref 0–37)
Albumin: 4.1 g/dL (ref 3.5–5.2)
Alkaline Phosphatase: 55 U/L (ref 39–117)
BUN: 8 mg/dL (ref 6–23)
CO2: 30 mEq/L (ref 19–32)
Calcium: 9.8 mg/dL (ref 8.4–10.5)
Chloride: 98 mEq/L (ref 96–112)
Creatinine, Ser: 0.8 mg/dL (ref 0.40–1.20)
GFR: 84.6 mL/min (ref >60.00)
Glucose, Bld: 226 mg/dL — ABNORMAL HIGH (ref 70–99)
Potassium: 4.1 mEq/L (ref 3.5–5.1)
Sodium: 138 mEq/L (ref 135–145)
Total Bilirubin: 0.5 mg/dL (ref 0.2–1.2)
Total Protein: 7.8 g/dL (ref 6.0–8.3)

## 2020-07-25 LAB — HEMOGLOBIN A1C: Hgb A1c MFr Bld: 9 % — ABNORMAL HIGH (ref 4.6–6.5)

## 2020-07-25 LAB — VITAMIN D 25 HYDROXY (VIT D DEFICIENCY, FRACTURES): VITD: 24.76 ng/mL — ABNORMAL LOW (ref 30.00–100.00)

## 2020-07-25 NOTE — Progress Notes (Signed)
No critical labs need to be addressed urgently. We will discuss labs in detail at upcoming office visit.   

## 2020-07-28 LAB — HEPATITIS C ANTIBODY
Hepatitis C Ab: NONREACTIVE
SIGNAL TO CUT-OFF: 0.03 (ref <1.00)

## 2020-07-28 NOTE — Progress Notes (Signed)
No critical labs need to be addressed urgently. We will discuss labs in detail at upcoming office visit.   

## 2020-08-01 ENCOUNTER — Encounter: Payer: Self-pay | Admitting: Family Medicine

## 2020-08-01 ENCOUNTER — Other Ambulatory Visit: Payer: Self-pay

## 2020-08-01 ENCOUNTER — Ambulatory Visit (INDEPENDENT_AMBULATORY_CARE_PROVIDER_SITE_OTHER): Payer: Medicaid Other | Admitting: Family Medicine

## 2020-08-01 VITALS — BP 142/92 | HR 107 | Temp 98.1°F | Ht 65.0 in | Wt 294.0 lb

## 2020-08-01 DIAGNOSIS — E785 Hyperlipidemia, unspecified: Secondary | ICD-10-CM

## 2020-08-01 DIAGNOSIS — E1169 Type 2 diabetes mellitus with other specified complication: Secondary | ICD-10-CM

## 2020-08-01 DIAGNOSIS — Z1211 Encounter for screening for malignant neoplasm of colon: Secondary | ICD-10-CM

## 2020-08-01 DIAGNOSIS — Z0000 Encounter for general adult medical examination without abnormal findings: Secondary | ICD-10-CM

## 2020-08-01 DIAGNOSIS — E119 Type 2 diabetes mellitus without complications: Secondary | ICD-10-CM

## 2020-08-01 DIAGNOSIS — I152 Hypertension secondary to endocrine disorders: Secondary | ICD-10-CM

## 2020-08-01 DIAGNOSIS — E1159 Type 2 diabetes mellitus with other circulatory complications: Secondary | ICD-10-CM

## 2020-08-01 LAB — HM DIABETES FOOT EXAM

## 2020-08-01 NOTE — Assessment & Plan Note (Addendum)
Borderline control, chronic. Compliant with meds. Continue current medication as on three meds max. Consider HTN clinic if not improving with continued weight loss.

## 2020-08-01 NOTE — Assessment & Plan Note (Signed)
Stable, chronic.  Continue current medication.    

## 2020-08-01 NOTE — Patient Instructions (Addendum)
Set up yearly eye exam for diabetes and have the opthalmologist send [place] a copy of the evaluation for the chart.  Make sure to take the second 2 metformin every night no matter what the sugar measurement.  Also try eating small breakfast of blueberries and protein in morning and you can try moving AM  2 metformin earlier in the day. Increase activity.   Call to schedule mammogram.

## 2020-08-01 NOTE — Progress Notes (Signed)
Chief Complaint  Patient presents with  . Annual Exam    History of Present Illness: HPI  The patient is here for annual wellness exam and preventative care.    Diabetes: poor control on amaryl 8 mg daily, actos 30 mg daily, metformin 500 mg 4 tablets daily ( only taking later day 2 tabs when CBGs high) Lab Results  Component Value Date   HGBA1C 9.0 (H) 07/25/2020  Using medications without difficulties: Hypoglycemic episodes: none Hyperglycemic episodes:yes Feet problems: no ulcers Blood Sugars averaging: FBS  159-229 eye exam within last year: Has schedule 09/03/2019  Elevated Cholesterol:Poor control despite crestor 40 mg daily.Marland Kitchen ate poorly at thanksgiving.. more animal fats. Lab Results  Component Value Date   CHOL 204 (H) 07/25/2020   HDL 37.40 (L) 07/25/2020   LDLCALC 145 (H) 07/25/2020   TRIG 111.0 07/25/2020   CHOLHDL 5 07/25/2020  Using medications without problems: Muscle aches:  Diet compliance: poor Exercise: none Other complaints:  Morbid obesity Body mass index is 48.92 kg/m.   She has lost 7 lbs in last year Wt Readings from Last 3 Encounters:  08/01/20 294 lb (133.4 kg)  04/01/20 295 lb 4 oz (133.9 kg)  05/01/19 (!) 301 lb 12 oz (136.9 kg)     Hypertension:  Not at goal on losartan 100 mg daily, HCTZ 25 mg daily and amlodipine 10 mg daily BP Readings from Last 3 Encounters:  08/01/20 (!) 142/92  04/01/20 140/80  05/01/19 (!) 142/98  Using medication without problems or lightheadedness: none Chest pain with exertion:none Edema: none Short of breath:none Average home BPs: 141/92 Other issues:    This visit occurred during the SARS-CoV-2 public health emergency.  Safety protocols were in place, including screening questions prior to the visit, additional usage of staff PPE, and extensive cleaning of exam room while observing appropriate contact time as indicated for disinfecting solutions.   COVID 19 screen:  No recent travel or known exposure  to COVID19 The patient denies respiratory symptoms of COVID 19 at this time. The importance of social distancing was discussed today.    Review of Systems  Constitutional: Negative for chills and fever.  HENT: Negative for congestion and ear pain.   Eyes: Negative for pain and redness.  Respiratory: Negative for cough and shortness of breath.   Cardiovascular: Negative for chest pain, palpitations and leg swelling.  Gastrointestinal: Negative for abdominal pain, blood in stool, constipation, diarrhea, nausea and vomiting.  Genitourinary: Negative for dysuria.  Musculoskeletal: Negative for falls and myalgias.  Skin: Negative for rash.  Neurological: Negative for dizziness.  Psychiatric/Behavioral: Negative for depression. The patient is not nervous/anxious.      Past Medical History:  Diagnosis Date  . Anemia 2008   iron deficiency  . Diabetes mellitus    on watch,not on meds  . History of blood transfusion 02/18/12   received 2 uprbc for hgb 6.5  . History of blood transfusion   . Hyperlipidemia   . Hypertension   . 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 Marcy Panning @ [place]    reports that she has never smoked. She has never used smokeless tobacco. She reports current alcohol use. She reports that she does not use drugs.  Current Outpatient Medications on File Prior to Visit  Medication Sig Dispense Refill  . amLODipine (NORVASC) 10 MG tablet Take 1 tablet (10 mg total) by mouth daily. 30 tablet 11  . aspirin 81 MG tablet Take 81  mg by mouth daily.    . Cholecalciferol (VITAMIN D3) 1.25 MG (50000 UT) CAPS Take 1 capsule by mouth every 7 (seven) days. 12 capsule 0  . glimepiride (AMARYL) 4 MG tablet Take 2 tablets by mouth once daily with breakfast 60 tablet 11  . hydrochlorothiazide (HYDRODIURIL) 25 MG tablet Take 1 tablet (25 mg total) by mouth daily. 30 tablet 11  . ibuprofen (ADVIL,MOTRIN) 800 MG tablet Take 1 tablet (800 mg total) by  mouth every 8 (eight) hours as needed for moderate pain. 21 tablet 0  . losartan (COZAAR) 100 MG tablet Take 1 tablet (100 mg total) by mouth daily. 30 tablet 11  . meloxicam (MOBIC) 15 MG tablet Take 1 tablet (15 mg total) by mouth daily. 30 tablet 3  . metFORMIN (GLUCOPHAGE-XR) 500 MG 24 hr tablet TAKE 4 TABLETS BY MOUTH ONCE DAILY WITH BREAKFAST 120 tablet 11  . ondansetron (ZOFRAN ODT) 4 MG disintegrating tablet Take 1 tablet (4 mg total) by mouth every 8 (eight) hours as needed for nausea or vomiting. 20 tablet 0  . pioglitazone (ACTOS) 30 MG tablet Take 1 tablet (30 mg total) by mouth daily. 30 tablet 11  . rosuvastatin (CRESTOR) 40 MG tablet Take 1 tablet (40 mg total) by mouth daily. 30 tablet 1  . [DISCONTINUED] potassium chloride SA (K-DUR,KLOR-CON) 20 MEQ tablet Take 1 tablet (20 mEq total) by mouth 2 (two) times daily. 20 tablet 1   No current facility-administered medications on file prior to visit.   Observations/Objective: Blood pressure (!) 142/92, pulse (!) 107, temperature 98.1 F (36.7 C), temperature source Temporal, height 5\' 5"  (1.651 m), weight 294 lb (133.4 kg), last menstrual period 02/07/2012, SpO2 97 %.  Physical Exam Constitutional:      General: She is not in acute distress.Vital signs are normal.     Appearance: Normal appearance. She is well-developed and well-nourished. She is obese. She is not ill-appearing or toxic-appearing.  HENT:     Head: Normocephalic.     Right Ear: Hearing, tympanic membrane, ear canal and external ear normal.     Left Ear: Hearing, tympanic membrane, ear canal and external ear normal.     Nose: Nose normal.  Eyes:     General: Lids are normal. Lids are everted, no foreign bodies appreciated.     Extraocular Movements: EOM normal.     Conjunctiva/sclera: Conjunctivae normal.     Pupils: Pupils are equal, round, and reactive to light.  Neck:     Thyroid: No thyroid mass or thyromegaly.     Vascular: No carotid bruit.      Trachea: Trachea normal.  Cardiovascular:     Rate and Rhythm: Normal rate and regular rhythm.     Pulses: Intact distal pulses.     Heart sounds: Normal heart sounds, S1 normal and S2 normal. No murmur heard. No gallop.   Pulmonary:     Effort: Pulmonary effort is normal. No respiratory distress.     Breath sounds: Normal breath sounds. No wheezing, rhonchi or rales.  Abdominal:     General: Bowel sounds are normal. There is no distension or abdominal bruit.     Palpations: Abdomen is soft. There is no fluid wave, hepatosplenomegaly or mass.     Tenderness: There is no abdominal tenderness. There is no CVA tenderness, guarding or rebound.     Hernia: No hernia is present.  Musculoskeletal:     Cervical back: Normal range of motion and neck supple.  Lymphadenopathy:  Cervical: No cervical adenopathy.     Upper Body:  No axillary adenopathy present. Skin:    General: Skin is warm, dry and intact.     Findings: No rash.  Neurological:     Mental Status: She is alert.     Cranial Nerves: No cranial nerve deficit.     Sensory: No sensory deficit.     Deep Tendon Reflexes: Strength normal.  Psychiatric:        Mood and Affect: Mood is not anxious or depressed.        Speech: Speech normal.        Behavior: Behavior normal. Behavior is cooperative.        Cognition and Memory: Cognition and memory normal.        Judgment: Judgment normal.      Diabetic foot exam: Normal inspection No skin breakdown No calluses  Normal DP pulses Normal sensation to light touch and monofilament Nails normal  Assessment and Plan The patient's preventative maintenance and recommended screening tests for an annual wellness exam were reviewed in full today. Brought up to date unless services declined.  Counselled on the importance of diet, exercise, and its role in overall health and mortality. The patient's FH and SH was reviewed, including their home life, tobacco status, and drug and  alcohol status.   Vaccines;uptodate COVID, due for flu PAP/DVE:nml pap 2016, HPV, next pap due in 5 year, no indication for DVE. DUE., plan at 3 months OV. Mammo: overdue.plans on scheduling. DUE HIV screen: refused. Colon ca screening: due for colonoscopy.. referral  nonsmoker ETOH: rare wine   Problem List Items Addressed This Visit    Diabetes mellitus with no complication, poor control.    Stable, chronic.  Continue current medication but make sure taking bedtime 2 tabs of metformin no matter what. Also try eating small breakfast and move AM metformin 2 tabs earlier.      Hyperlipidemia associated with type 2 diabetes mellitus (HCC)    Stable, chronic.  Continue current medication.         Hypertension associated with diabetes ([place])    Borderline control, chronic. Compliant with meds. Continue current medication as on three meds max. Consider HTN clinic if not improving with continued weight loss.          Other Visit Diagnoses    Routine general medical examination at a health care facility    -  Primary   Colon cancer screening       Relevant Orders   Ambulatory referral to Gastroenterology     Orders Placed This Encounter  Procedures  . Ambulatory referral to Gastroenterology    Referral Priority:   Routine    Referral Type:   Consultation    Referral Reason:   Specialty Services Required    Number of Visits Requested:   1  . [facility] DIABETES FOOT EXAM    This external order was created through the Results Console.     Eliezer Lofts, MD

## 2020-08-01 NOTE — Assessment & Plan Note (Signed)
Stable, chronic.  Continue current medication but make sure taking bedtime 2 tabs of metformin no matter what. Also try eating small breakfast and move AM metformin 2 tabs earlier.

## 2020-09-03 ENCOUNTER — Encounter: Payer: Self-pay | Admitting: Internal Medicine

## 2020-10-07 ENCOUNTER — Encounter: Payer: No Typology Code available for payment source | Admitting: Internal Medicine

## 2020-10-20 MED ORDER — PIOGLITAZONE HCL 30 MG PO TABS: 30.0000 mg | ORAL_TABLET | Freq: Every day | ORAL | 3 refills | Status: DC

## 2020-10-20 MED ORDER — HYDROCHLOROTHIAZIDE 25 MG PO TABS: 25.0000 mg | ORAL_TABLET | Freq: Every day | ORAL | 3 refills | Status: DC

## 2020-10-20 MED ORDER — LOSARTAN POTASSIUM 100 MG PO TABS: 100.0000 mg | ORAL_TABLET | Freq: Every day | ORAL | 3 refills | Status: DC

## 2020-10-20 MED ORDER — GLIMEPIRIDE 4 MG PO TABS: ORAL_TABLET | ORAL | 3 refills | Status: DC

## 2020-10-20 MED ORDER — METFORMIN HCL ER 500 MG PO TB24: ORAL_TABLET | ORAL | 3 refills | Status: DC

## 2020-10-20 MED ORDER — ROSUVASTATIN CALCIUM 40 MG PO TABS: 40.0000 mg | ORAL_TABLET | Freq: Every day | ORAL | 3 refills | Status: DC

## 2020-10-20 MED ORDER — AMLODIPINE BESYLATE 10 MG PO TABS: 10.0000 mg | ORAL_TABLET | Freq: Every day | ORAL | 3 refills | Status: DC

## 2020-10-20 NOTE — Telephone Encounter (Signed)
Prescriptions printed and faxed to [facility] as requested by patient.

## 2020-10-31 ENCOUNTER — Ambulatory Visit: Payer: No Typology Code available for payment source | Admitting: Family Medicine

## 2021-01-07 ENCOUNTER — Other Ambulatory Visit: Payer: Self-pay | Admitting: Family Medicine

## 2021-01-09 ENCOUNTER — Other Ambulatory Visit: Payer: Self-pay | Admitting: Family Medicine

## 2021-06-01 ENCOUNTER — Other Ambulatory Visit: Payer: Self-pay | Admitting: Family Medicine

## 2021-06-09 NOTE — Telephone Encounter (Signed)
Hey Ladies  Is this something that you all can help with?  Thanks  Noreene Larsson, [address], [place], [place] [address] Direct Dial: [phone] [email or web address]: [email or web address]

## 2021-06-10 ENCOUNTER — Encounter (HOSPITAL_COMMUNITY): Payer: Self-pay

## 2021-06-12 ENCOUNTER — Other Ambulatory Visit: Payer: Self-pay | Admitting: Family Medicine

## 2021-06-12 DIAGNOSIS — Z789 Other specified health status: Secondary | ICD-10-CM

## 2021-06-12 NOTE — Telephone Encounter (Signed)
Hi Dr. Diona Browner!  Can you please put this patient request in as an SDOH referral so we can look into community resources for her?  Thank you so much!

## 2021-06-16 ENCOUNTER — Telehealth: Payer: Self-pay | Admitting: *Deleted

## 2021-06-16 NOTE — Telephone Encounter (Signed)
   Telephone encounter was:  Successful.  06/16/2021 Name: Courtney Cox MRN: [id number] DOB: Sep 25, 1965  Courtney Cox is a 55 y.o. year old female who is a primary care patient of Bedsole, Amy E, MD . The community resource team was consulted for assistance with Provided patient with number to call to get mammogram without insurance , also provided call back information for careguide if she needs any other assistance   Care guide performed the following interventions: Patient provided with information about care guide support team and interviewed to confirm resource needs Follow up call placed to community resources to determine status of patients referral.  Follow Up Plan:  No further follow up planned at this time. The patient has been provided with needed resources.  [place], Care Management  [phone] [address] , [place] [address] Email : [email or web address]

## 2021-07-01 ENCOUNTER — Encounter: Payer: Self-pay | Admitting: Oncology

## 2021-07-01 ENCOUNTER — Telehealth: Payer: Self-pay | Admitting: Family Medicine

## 2021-07-01 NOTE — Telephone Encounter (Signed)
Pt called for prescription refill

## 2021-07-07 ENCOUNTER — Other Ambulatory Visit: Payer: Self-pay

## 2021-07-07 ENCOUNTER — Ambulatory Visit: Payer: Self-pay | Admitting: Family Medicine

## 2021-07-07 ENCOUNTER — Encounter: Payer: Self-pay | Admitting: Family Medicine

## 2021-07-07 DIAGNOSIS — B009 Herpesviral infection, unspecified: Secondary | ICD-10-CM | POA: Insufficient documentation

## 2021-07-07 DIAGNOSIS — Z708 Other sex counseling: Secondary | ICD-10-CM

## 2021-07-07 MED ORDER — ACYCLOVIR 800 MG PO TABS: 800.0000 mg | ORAL_TABLET | Freq: Two times a day (BID) | ORAL | 12 refills | Status: DC

## 2021-07-07 NOTE — Progress Notes (Signed)
S: Patient in clinic for HSV medication refill.  Patient reports running out  of medication.  Last outbreak was in 2019 and started to to have tingling as if outbreak is going to happen.     O: + HSV 2 dx 12/2017   A/P: 1. HSV infection - acyclovir (ZOVIRAX) 800 MG tablet; Take 1 tablet (800 mg total) by mouth 2 (two) times daily.  Dispense: 10 tablet; Refill: 12  Sexually transmitted disease counseling, done today.   Pt prefers to take medication episodically.   Discussed with patient about the need for annual visits with provider to get refills.  Declined need for other STI testing today.   RX sent to desired pharmacy      Junious Dresser, FNP

## 2021-07-14 ENCOUNTER — Encounter: Payer: Self-pay | Admitting: Family Medicine

## 2021-07-18 ENCOUNTER — Other Ambulatory Visit: Payer: Self-pay

## 2021-07-18 ENCOUNTER — Emergency Department (HOSPITAL_COMMUNITY)
Admission: EM | Admit: 2021-07-18 | Discharge: 2021-07-18 | Disposition: A | Discharge status: Home / Self Care | Payer: No Typology Code available for payment source | Attending: Emergency Medicine | Admitting: Emergency Medicine

## 2021-07-18 ENCOUNTER — Encounter (HOSPITAL_COMMUNITY): Payer: Self-pay

## 2021-07-18 DIAGNOSIS — Z7984 Long term (current) use of oral hypoglycemic drugs: Secondary | ICD-10-CM | POA: Insufficient documentation

## 2021-07-18 DIAGNOSIS — N3000 Acute cystitis without hematuria: Secondary | ICD-10-CM | POA: Insufficient documentation

## 2021-07-18 DIAGNOSIS — Z79899 Other long term (current) drug therapy: Secondary | ICD-10-CM | POA: Insufficient documentation

## 2021-07-18 DIAGNOSIS — Z7982 Long term (current) use of aspirin: Secondary | ICD-10-CM | POA: Insufficient documentation

## 2021-07-18 DIAGNOSIS — R300 Dysuria: Secondary | ICD-10-CM | POA: Insufficient documentation

## 2021-07-18 DIAGNOSIS — I10 Essential (primary) hypertension: Secondary | ICD-10-CM | POA: Insufficient documentation

## 2021-07-18 DIAGNOSIS — R21 Rash and other nonspecific skin eruption: Secondary | ICD-10-CM | POA: Insufficient documentation

## 2021-07-18 DIAGNOSIS — E119 Type 2 diabetes mellitus without complications: Secondary | ICD-10-CM | POA: Insufficient documentation

## 2021-07-18 LAB — URINALYSIS, ROUTINE W REFLEX MICROSCOPIC
Bilirubin Urine: NEGATIVE
Glucose, UA: 100 mg/dL — AB
Ketones, ur: NEGATIVE mg/dL
Nitrite: NEGATIVE
Protein, ur: 30 mg/dL — AB
Specific Gravity, Urine: 1.015 (ref 1.005–1.030)
pH: 6 (ref 5.0–8.0)

## 2021-07-18 LAB — URINALYSIS, MICROSCOPIC (REFLEX)

## 2021-07-18 MED ORDER — CEPHALEXIN 500 MG PO CAPS: 500.0000 mg | ORAL_CAPSULE | Freq: Four times a day (QID) | ORAL | 0 refills | Status: DC

## 2021-07-18 MED ORDER — PREDNISONE 10 MG PO TABS: 40.0000 mg | ORAL_TABLET | Freq: Every day | ORAL | 0 refills | Status: DC

## 2021-07-18 NOTE — Discharge Instructions (Addendum)
The antibiotic should treat possible skin infection and a urinary tract infection.  Steroid should help with this rash is related to allergic reaction.  Do not take the Azo.  Follow-up with your doctor as needed.  Watch your sugars at home.

## 2021-07-18 NOTE — ED Triage Notes (Signed)
Rash that began on Tuesday, recent [place] trip with suspected UTI, took OTC azo & then began having facial rash. OTC zyrtec without relief. States facial swelling today. No trouble swallowing or breathing.

## 2021-07-18 NOTE — ED Provider Notes (Signed)
[facility] EMERGENCY DEPARTMENT Provider Note   CSN: [id number] Arrival date & time: 07/18/21  4496     History Chief Complaint  Patient presents with   Rash    Courtney Cox is a 55 y.o. female.   Rash Associated symptoms: no abdominal pain and no shortness of breath   Patient presents with rash on her face.  Has had for last few days.  Began after taking Azo for UTI.  States she also had been on vacation and had taken a bath with some Epson salts.  Did wash her face.  Developed the rash and it was initially itchy.  Took some Zyrtec which did not improve it.  Has not have Azo before.  Mostly on the face but states there is some other areas.  Not painful.  No fevers.  No difficulty breathing.  No difficulty swallowing.  No swelling in her mouth. Also still having some dysuria.  No fevers.  No abdominal pain.    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 08/08/2014   Hypertension associated with diabetes ([place]) 08/08/2014   Hyperlipidemia associated with type 2 diabetes mellitus ([place]) 08/08/2014   Vitamin D deficiency 08/08/2014   Hx of iron deficiency anemia 08/08/2014    Past Surgical History:  Procedure Laterality Date   ABDOMINAL HYSTERECTOMY  03/09/2012   Procedure: HYSTERECTOMY ABDOMINAL;  Surgeon: Allena Katz, MD;  Location: [facility];  Service: Gynecology;  Laterality: N/A;   TOTAL ABDOMINAL HYSTERECTOMY   HYSTEROSCOPY W/ ENDOMETRIAL HYDROTHERMAL ABLATION  12-31-2008   MENOMETRORRHAGIA   LAPAROSCOPIC ASSISTED VAGINAL HYSTERECTOMY  03/09/2012   Procedure: LAPAROSCOPIC ASSISTED VAGINAL HYSTERECTOMY;  Surgeon: Allena Katz, MD;  Location: [facility];  Service: Gynecology;  Laterality: N/A;  Attempted but had to do abdominal hysterectomy     OB History     Gravida  4   Para  3   Term  3   Preterm      AB  1   Living  3      SAB  1   IAB      Ectopic      Multiple      Live Births              Family History  Problem Relation Age of Onset   Diabetes Father    Heart disease Father    Hypertension Father    Heart disease Mother    Diabetes Mother    Diabetes Sister    Diabetes Maternal Grandmother    Diabetes Maternal Grandfather    Diabetes Paternal Grandmother    Diabetes Paternal Grandfather     Social History   Tobacco Use   Smoking  status: Never   Smokeless tobacco: Never  Vaping Use   Vaping Use: Never used  Substance Use Topics   Alcohol use: Not Currently    Comment: socially   Drug use: No    Home Medications Prior to Admission medications   Medication Sig Start Date End Date Taking? Authorizing Provider  cephALEXin (KEFLEX) 500 MG capsule Take 1 capsule (500 mg total) by mouth 4 (four) times daily. 07/18/21  Yes Davonna Belling, MD  predniSONE (DELTASONE) 10 MG tablet Take 4 tablets (40 mg total) by mouth daily. 07/18/21  Yes Davonna Belling, MD  acyclovir (ZOVIRAX) 800 MG tablet Take 1 tablet (800 mg total) by mouth 2 (two) times daily. 07/07/21   Junious Dresser, FNP  amLODipine (NORVASC) 10 MG tablet Take 1 tablet (10 mg total) by mouth daily. 10/20/20   Bedsole, Amy E, MD  aspirin 81 MG tablet Take 81 mg by mouth daily.    [provider]  Cholecalciferol (VITAMIN D3) 1.25 MG (50000  UT) CAPS Take 1 capsule by mouth every 7 (seven) days. 05/22/19   Bedsole, Amy E, MD  glimepiride (AMARYL) 4 MG tablet TAKE 2 TABLETS BY MOUTH ONCE DAILY WITH BREAKFAST 01/09/21   Bedsole, Amy E, MD  hydrochlorothiazide (HYDRODIURIL) 25 MG tablet Take 1 tablet (25 mg total) by mouth daily. 10/20/20   Bedsole, Amy E, MD  ibuprofen (ADVIL,MOTRIN) 800 MG tablet Take 1 tablet (800 mg total) by mouth every 8 (eight) hours as needed for moderate pain. 09/23/18   Long, Wonda Olds, MD  losartan (COZAAR) 100 MG tablet Take 1 tablet (100 mg total) by mouth daily. 10/20/20   Bedsole, Amy E, MD  meloxicam (MOBIC) 15 MG tablet Take 1 tablet (15 mg total) by mouth daily. 06/28/18   Hyatt, Max T, DPM  metFORMIN (GLUCOPHAGE-XR) 500 MG 24 hr tablet TAKE 4 TABLETS BY MOUTH ONCE DAILY WITH BREAKFAST 10/20/20   Bedsole, Amy E, MD  ondansetron (ZOFRAN ODT) 4 MG disintegrating tablet Take 1 tablet (4 mg total) by mouth every 8 (eight) hours as needed for nausea or vomiting. 09/23/18   Long, Wonda Olds, MD  pioglitazone (ACTOS) 30 MG tablet Take 1 tablet (30 mg total) by mouth daily. 10/20/20   Bedsole, Amy E, MD  rosuvastatin (CRESTOR) 40 MG tablet Take 1 tablet by mouth once daily 06/02/21   Bedsole, Amy E, MD  potassium chloride SA (K-DUR,KLOR-CON) 20 MEQ tablet Take 1 tablet (20 mEq total) by mouth 2 (two) times daily. 10/12/11 11/10/11  Marcy Panning, MD    Allergies    Patient has no known allergies.  Review of Systems   Review of Systems  Constitutional:  Negative for appetite change.  HENT:  Negative for congestion.   Respiratory:  Negative for shortness of breath.   Cardiovascular:  Negative for chest pain.  Gastrointestinal:  Negative for abdominal pain.  Genitourinary:  Positive for dysuria.  Skin:  Positive for rash.  Neurological:  Negative for weakness.  Psychiatric/Behavioral:  Negative for confusion.    Physical Exam Updated Vital Signs BP (!) 162/102 (BP Location: Left Wrist)   Pulse (!) 105   Temp 99.1 F  (37.3 C) (Oral)   Resp 18   Ht 5' 4.5" (1.638 m)   Wt 127 kg   LMP 02/07/2012   SpO2 100%   BMI 47.32 kg/m   Physical Exam Vitals and nursing note reviewed.  HENT:     Head: Atraumatic.  Eyes:     General: No scleral icterus.  Pupils: Pupils are equal, round, and reactive to light.  Abdominal:     Tenderness: There is no abdominal tenderness.  Musculoskeletal:        General: No tenderness.     Cervical back: Neck supple.  Skin:    Capillary Refill: Capillary refill takes less than 2 seconds.     Comments: Facial rash.  Different areas on the face appears somewhat different.  On the tip of the nose there is erythema with swelling.  To the right cheek there is more of a hive type appearance.  There is almost inferior vesicles.  There is some crusting in the left nasolabial fold.  More of the induration on the left cheek area.  Neurological:     Mental Status: She is alert and oriented to person, place, and time.    ED Results / Procedures / Treatments   Labs (all labs ordered are listed, but only abnormal results are displayed) Labs Reviewed  URINALYSIS, ROUTINE W REFLEX MICROSCOPIC - Abnormal; Notable for the following components:      Result Value   APPearance CLOUDY (*)    Glucose, UA 100 (*)    Hgb urine dipstick SMALL (*)    Protein, ur 30 (*)    Leukocytes,Ua MODERATE (*)    All other components within normal limits  URINALYSIS, MICROSCOPIC (REFLEX) - Abnormal; Notable for the following components:   Bacteria, UA MANY (*)    All other components within normal limits    EKG None  Radiology No results found.  Procedures Procedures   Medications Ordered in ED Medications - No data to display  ED Course  I have reviewed the triage vital signs and the nursing notes.  Pertinent labs & imaging results that were available during my care of the patient were reviewed by me and considered in my medical decision making (see chart for details).    MDM  Rules/Calculators/A&P                           Patient with rash to face.  No history of rashes.  No history of allergies.  Rash is somewhat different appearance is at different areas.  Somewhat indurated some almost vesicular.  Some crusting.  Not painful.  Will treat with some antibiotics with the crustiness and induration.  We will also treat with steroids.  Viral felt less likely but does have history of HSV.  Also has UTI.  Will treat with Keflex which should cover both skin and UTI.  Discharge home with PCP follow-up as needed peer Final Clinical Impression(s) / ED Diagnoses Final diagnoses:  Facial rash  Acute cystitis without hematuria    Rx / DC Orders ED Discharge Orders          Ordered    cephALEXin (KEFLEX) 500 MG capsule  4 times daily        07/18/21 1013    predniSONE (DELTASONE) 10 MG tablet  Daily        07/18/21 1013             Davonna Belling, MD 07/18/21 1022

## 2021-07-21 ENCOUNTER — Ambulatory Visit: Payer: No Typology Code available for payment source | Admitting: Family Medicine

## 2021-07-28 NOTE — Telephone Encounter (Signed)
Return call to patient today regarding her message in th queue about a medication refill.  She reports someone had already called her and took care of it and has what she needs. Dahlia Bailiff, RN

## 2021-08-10 ENCOUNTER — Emergency Department (HOSPITAL_COMMUNITY)
Admission: EM | Admit: 2021-08-10 | Discharge: 2021-08-10 | Disposition: A | Discharge status: Home / Self Care | Payer: No Typology Code available for payment source | Attending: Emergency Medicine | Admitting: Emergency Medicine

## 2021-08-10 ENCOUNTER — Encounter: Payer: Self-pay | Admitting: Oncology

## 2021-08-10 ENCOUNTER — Encounter (HOSPITAL_COMMUNITY): Payer: Self-pay | Admitting: *Deleted

## 2021-08-10 DIAGNOSIS — Z7982 Long term (current) use of aspirin: Secondary | ICD-10-CM | POA: Insufficient documentation

## 2021-08-10 DIAGNOSIS — Z79899 Other long term (current) drug therapy: Secondary | ICD-10-CM | POA: Insufficient documentation

## 2021-08-10 DIAGNOSIS — Z7984 Long term (current) use of oral hypoglycemic drugs: Secondary | ICD-10-CM | POA: Insufficient documentation

## 2021-08-10 DIAGNOSIS — E119 Type 2 diabetes mellitus without complications: Secondary | ICD-10-CM | POA: Insufficient documentation

## 2021-08-10 DIAGNOSIS — L509 Urticaria, unspecified: Secondary | ICD-10-CM | POA: Insufficient documentation

## 2021-08-10 DIAGNOSIS — I10 Essential (primary) hypertension: Secondary | ICD-10-CM | POA: Insufficient documentation

## 2021-08-10 MED ORDER — PREDNISONE 50 MG PO TABS: 50.0000 mg | ORAL_TABLET | Freq: Every day | ORAL | 0 refills | Status: AC

## 2021-08-10 MED ORDER — PREDNISONE 50 MG PO TABS
60.0000 mg | ORAL_TABLET | Freq: Once | ORAL | Status: AC
  Administered 2021-08-10: 16:00:00 60 mg via ORAL
  Filled 2021-08-10: qty 1

## 2021-08-10 NOTE — ED Triage Notes (Signed)
Facial swelling with nasal itching onset yesterday

## 2021-08-10 NOTE — ED Provider Notes (Signed)
[facility] EMERGENCY DEPARTMENT Provider Note   CSN: [id number] Arrival date & time: 08/10/21  1339    History Chief Complaint  Patient presents with   Allergic Reaction    Courtney Cox is a 55 y.o. female with history significant for hypertension, diabetes who presents for evaluation of facial rash.  Began yesterday.  Initially started out with pruritus to neck and face.  Had something similar a month ago.  States prednisone resolved Sx.  He has been taking Benadryl at home which helped with pruritis.  States she did use [employer] on her face which she has used previously without symptoms.  No sensation of throat closing, shortness of breath, emesis, abdominal pain, hx of anaphylaxis.  No recent medication changes.  Not on ACE inhibitor.  No history of angioedema.  No pain.  Denies additional aggravating or relieving factors.  History obtained from patient and past medical records.  No interpreter used.      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 08/08/2014   Hypertension associated with diabetes ([place]) 08/08/2014   Hyperlipidemia associated with type 2 diabetes mellitus ([place]) 08/08/2014   Vitamin D deficiency 08/08/2014   Hx of iron deficiency anemia 08/08/2014    Past Surgical History:   Procedure Laterality Date   ABDOMINAL HYSTERECTOMY  03/09/2012   Procedure: HYSTERECTOMY ABDOMINAL;  Surgeon: Allena Katz, MD;  Location: [facility];  Service: Gynecology;  Laterality: N/A;   TOTAL ABDOMINAL HYSTERECTOMY   HYSTEROSCOPY W/ ENDOMETRIAL HYDROTHERMAL ABLATION  12-31-2008   MENOMETRORRHAGIA   LAPAROSCOPIC ASSISTED VAGINAL HYSTERECTOMY  03/09/2012   Procedure: LAPAROSCOPIC ASSISTED VAGINAL HYSTERECTOMY;  Surgeon: Allena Katz, MD;  Location: [place];  Service: Gynecology;  Laterality: N/A;  Attempted but had to do abdominal hysterectomy     OB History     Gravida  4   Para  3   Term  3   Preterm      AB  1   Living  3      SAB  1   IAB      Ectopic      Multiple      Live Births              Family History  Problem Relation Age of Onset   Diabetes Father    Heart disease Father    Hypertension Father    Heart disease Mother    Diabetes Mother    Diabetes Sister    Diabetes Maternal Grandmother    Diabetes Maternal Grandfather    Diabetes Paternal Grandmother    Diabetes Paternal Grandfather     Social History   Tobacco Use  Smoking status: Never   Smokeless tobacco: Never  Vaping Use   Vaping Use: Never used  Substance Use Topics   Alcohol use: Not Currently    Comment: socially   Drug use: No    Home Medications Prior to Admission medications   Medication Sig Start Date End Date Taking? Authorizing Provider  predniSONE (DELTASONE) 50 MG tablet Take 1 tablet (50 mg total) by mouth daily for 5 days. 08/10/21 08/15/21 Yes Kabe Mckoy A, PA-C  acyclovir (ZOVIRAX) 800 MG tablet Take 1 tablet (800 mg total) by mouth 2 (two) times daily. 07/07/21   Junious Dresser, FNP  amLODipine (NORVASC) 10 MG tablet Take 1 tablet (10 mg total) by mouth daily. 10/20/20   Bedsole, Amy E, MD  aspirin 81 MG tablet Take 81 mg by mouth daily.    [provider]  cephALEXin (KEFLEX) 500 MG capsule  Take 1 capsule (500 mg total) by mouth 4 (four) times daily. 07/18/21   Davonna Belling, MD  Cholecalciferol (VITAMIN D3) 1.25 MG (50000 UT) CAPS Take 1 capsule by mouth every 7 (seven) days. 05/22/19   Bedsole, Amy E, MD  glimepiride (AMARYL) 4 MG tablet TAKE 2 TABLETS BY MOUTH ONCE DAILY WITH BREAKFAST 01/09/21   Bedsole, Amy E, MD  hydrochlorothiazide (HYDRODIURIL) 25 MG tablet Take 1 tablet (25 mg total) by mouth daily. 10/20/20   Bedsole, Amy E, MD  ibuprofen (ADVIL,MOTRIN) 800 MG tablet Take 1 tablet (800 mg total) by mouth every 8 (eight) hours as needed for moderate pain. 09/23/18   Long, Wonda Olds, MD  losartan (COZAAR) 100 MG tablet Take 1 tablet (100 mg total) by mouth daily. 10/20/20   Bedsole, Amy E, MD  meloxicam (MOBIC) 15 MG tablet Take 1 tablet (15 mg total) by mouth daily. 06/28/18   Hyatt, Max T, DPM  metFORMIN (GLUCOPHAGE-XR) 500 MG 24 hr tablet TAKE 4 TABLETS BY MOUTH ONCE DAILY WITH BREAKFAST 10/20/20   Bedsole, Amy E, MD  ondansetron (ZOFRAN ODT) 4 MG disintegrating tablet Take 1 tablet (4 mg total) by mouth every 8 (eight) hours as needed for nausea or vomiting. 09/23/18   Long, Wonda Olds, MD  pioglitazone (ACTOS) 30 MG tablet Take 1 tablet (30 mg total) by mouth daily. 10/20/20   Bedsole, Amy E, MD  rosuvastatin (CRESTOR) 40 MG tablet Take 1 tablet by mouth once daily 06/02/21   Bedsole, Amy E, MD  potassium chloride SA (K-DUR,KLOR-CON) 20 MEQ tablet Take 1 tablet (20 mEq total) by mouth 2 (two) times daily. 10/12/11 11/10/11  Marcy Panning, MD    Allergies    Patient has no known allergies.  Review of Systems   Review of Systems  Constitutional: Negative.   HENT: Negative.    Respiratory: Negative.    Cardiovascular: Negative.   Gastrointestinal: Negative.   Genitourinary: Negative.   Musculoskeletal: Negative.   Skin:  Positive for rash.  Neurological: Negative.   All other systems reviewed and are negative.  Physical Exam Updated Vital Signs BP (!) 158/91 (BP Location:  Right Arm)    Pulse 91    Temp 98.2 F (36.8 C) (Oral)    Resp 16    Ht 5' 5.5" (1.664 m)    Wt 125.6 kg    LMP 02/07/2012    SpO2 100%    BMI 45.39 kg/m   Physical Exam Vitals and nursing note reviewed.  Constitutional:      General: She is not in acute distress.    Appearance: She is well-developed.  She is not ill-appearing, toxic-appearing or diaphoretic.  HENT:     Head: Normocephalic and atraumatic.     Nose: Nose normal. No congestion or rhinorrhea.     Mouth/Throat:     Mouth: Mucous membranes are moist.     Pharynx: Oropharynx is clear. No oropharyngeal exudate or posterior oropharyngeal erythema.     Comments:  PO clear, no angioedema.  Tongue midline.  No pooling of secretions. Eyes:     Pupils: Pupils are equal, round, and reactive to light.  Cardiovascular:     Rate and Rhythm: Normal rate.     Pulses: Normal pulses.     Heart sounds: Normal heart sounds.  Pulmonary:     Effort: Pulmonary effort is normal. No respiratory distress.     Breath sounds: Normal breath sounds.  Chest:     Comments: Urticaria to left upper chest wall, posterior left shoulder Abdominal:     General: Bowel sounds are normal. There is no distension.     Palpations: Abdomen is soft.  Musculoskeletal:        General: Normal range of motion.     Cervical back: Normal range of motion and neck supple.     Comments: No bony tenderness, full range of motion.  Skin:    General: Skin is warm and dry.     Capillary Refill: Capillary refill takes less than 2 seconds.     Findings: Rash present.     Comments: Diffuse urticaria to face.  No angioedema.  No fluctuance, induration, vesicles, target lesions, bulla, desquamated skin.  Neurological:     General: No focal deficit present.     Mental Status: She is alert and oriented to person, place, and time.  Psychiatric:        Mood and Affect: Mood normal.   ED Results / Procedures / Treatments   Labs (all labs ordered are listed, but only abnormal  results are displayed) Labs Reviewed - No data to display  EKG None  Radiology No results found.  Procedures Procedures   Medications Ordered in ED Medications  predniSONE (DELTASONE) tablet 60 mg (60 mg Oral Given 08/10/21 1559)   ED Course  I have reviewed the triage vital signs and the nursing notes.  Pertinent labs & imaging results that were available during my care of the patient were reviewed by me and considered in my medical decision making (see chart for details).  Pleasant 55 year old here for evaluation of facial rash which began yesterday.  History of something similar 1 month ago.  Resolved with prednisone.  Does state that she used [employer] on her face which she has used previously however does not use regularly prior to rash.  She has diffuse urticaria to her face, left neck and shoulder.  No evidence of angioedema on exam.  Posterior oropharynx is clear.  Tolerating p.o. intake.  No sensation of throat closing, shortness of breath, chest pain, abdominal pain or emesis.  Rash consistent with urticaria. No blisters, no pustules, no warmth, no draining sinus tracts, no superficial abscesses, no bullous impetigo, no vesicles, no desquamation, no target lesions with dusky purpura or a central bulla. Not tender to touch. No concern for superimposed infection. No concern for SJS, TEN, TSS, tick borne illness, syphilis or other life-threatening condition.  Patient re-evaluated prior to dc, is hemodynamically stable, in no respiratory distress, and denies the feeling of throat closing. Pt has been advised to take OTC benadryl & return to  the ED if they have a mod-severe allergic rxn (s/s including throat closing, difficulty breathing, swelling of lips face or tongue). Pt is to follow up with their PCP. Pt is agreeable with plan & verbalizes understanding.   The patient has been appropriately medically screened and/or stabilized in the ED. I have low suspicion for any other  emergent medical condition which would require further screening, evaluation or treatment in the ED or require inpatient management.  Patient is hemodynamically stable and in no acute distress.  Patient able to ambulate in department prior to ED.  Evaluation does not show acute pathology that would require ongoing or additional emergent interventions while in the emergency department or further inpatient treatment.  I have discussed the diagnosis with the patient and answered all questions.  Pain is been managed while in the emergency department and patient has no further complaints prior to discharge.  Patient is comfortable with plan discussed in room and is stable for discharge at this time.  I have discussed strict return precautions for returning to the emergency department.  Patient was encouraged to follow-up with PCP/specialist refer to at discharge.       MDM Rules/Calculators/A&P                             Final Clinical Impression(s) / ED Diagnoses Final diagnoses:  Urticaria    Rx / DC Orders ED Discharge Orders          Ordered    predniSONE (DELTASONE) 50 MG tablet  Daily        08/10/21 1602             Chyna Kneece A, PA-C 08/10/21 1604    Wyvonnia Dusky, MD 08/10/21 1714

## 2021-08-10 NOTE — Discharge Instructions (Signed)
The rash to your face is most consistent with allergic reaction.  Take the prednisone as prescribed.  You may also take additional Benadryl or Zyrtec at home.  Follow-up with primary care provider over the next 2 days for reevaluation.  Return for new or worsening symptoms such as severe lip swelling, sensation of throat closing, shortness of breath, vomiting.

## 2021-08-25 ENCOUNTER — Telehealth: Payer: Self-pay | Admitting: Family Medicine

## 2021-08-25 MED ORDER — PREDNISONE 20 MG PO TABS: ORAL_TABLET | ORAL | 0 refills | Status: DC

## 2021-08-25 NOTE — Telephone Encounter (Signed)
See  phone note from today 08/25/21.  This has all been addressed.

## 2021-08-25 NOTE — Telephone Encounter (Signed)
Please triage

## 2021-08-25 NOTE — Telephone Encounter (Signed)
Courtney Cox called in wnted to know if she can get script of predisone due to the allergic reaction she is having in [facility]. If medication is sent it can be sent to [place] [phone]

## 2021-08-25 NOTE — Telephone Encounter (Signed)
Courtney Cox notified as instructed by telephone.  Patient states understanding.

## 2021-08-25 NOTE — Telephone Encounter (Signed)
Tried to call patient to triage and got her voicemail. Left a message on voicemail for patient to call the office back ASAP.

## 2021-08-25 NOTE — Telephone Encounter (Signed)
Will call in prednsione, she needs to keep follow up with me. Please advise her to al;so to take OTC antihistamine daily if not already using ( such as zyrtec ) until appt & return to the ED if they she has a mod-severe allergic rxn (s/s including throat closing, difficulty breathing, swelling of lips face or tongue).

## 2021-08-25 NOTE — Telephone Encounter (Signed)
Spoke to patient by telephone and she denies any SOB or difficulty breathing. Patient stated that this is an ongoing problem that she has been having for months. Patient stated that she has been to the ER twice with this and they gave her prednisone for about 5 days which took care of the hives. Patient stated that she has been keeping a log to try and determine what may be causing her to have hives on her face off and on.Patient was advised that she should go to an Urgent Care in [place] where she is now. Patient stated that she does not feel that she needs to go to an UC. Patient stated that her lips are a little swollen like before but no problem with her tongue or breathing. Patient was given ER precautions and she verbalized understanding. Patient stated that she has an upcoming appointment scheduled with Dr. Diona Browner next week. Patient stated that she would like Prednisone called in to the [place] that she left earlier.

## 2021-09-01 ENCOUNTER — Ambulatory Visit (INDEPENDENT_AMBULATORY_CARE_PROVIDER_SITE_OTHER): Payer: No Typology Code available for payment source | Admitting: Family Medicine

## 2021-09-01 ENCOUNTER — Encounter: Payer: Self-pay | Admitting: Family Medicine

## 2021-09-01 ENCOUNTER — Other Ambulatory Visit: Payer: Self-pay

## 2021-09-01 VITALS — BP 140/90 | HR 89 | Temp 98.0°F | Ht 65.5 in | Wt 275.5 lb

## 2021-09-01 DIAGNOSIS — E119 Type 2 diabetes mellitus without complications: Secondary | ICD-10-CM

## 2021-09-01 DIAGNOSIS — T7840XD Allergy, unspecified, subsequent encounter: Secondary | ICD-10-CM

## 2021-09-01 DIAGNOSIS — T7840XA Allergy, unspecified, initial encounter: Secondary | ICD-10-CM | POA: Insufficient documentation

## 2021-09-01 MED ORDER — PREDNISONE 20 MG PO TABS: ORAL_TABLET | ORAL | 0 refills | Status: DC

## 2021-09-01 NOTE — Progress Notes (Signed)
Patient ID: Courtney Cox, female    DOB: 1965/12/27, 56 y.o.   MRN: [id number]  This visit was conducted in person.  BP 140/90    Pulse 89    Temp 98 F (36.7 C) (Temporal)    Ht 5' 5.5" (1.664 m)    Wt 275 lb 8 oz (125 kg)    LMP 02/07/2012    SpO2 98%    BMI 45.15 kg/m    CC: Chief Complaint  Patient presents with   Follow-up    Allergic Reaction    Subjective:   HPI: Courtney Cox is a 56 y.o. female presenting on 09/01/2021 for Follow-up (Allergic Reaction)  New onset allergic reaction episodes, recurrent:  She reports first episode occurring when on trip to [place].  Nov 29... facial lips swelling and redness. Itchy.  No other rash.  Went the ER... treated with steroid dose pack.. resolved.  Went back to [place] on 12/19... recurrent episode after 4 days of being there.. treated again with prednisone.. within a day it resolves.  1/3 went back again to [facility].Marland Kitchen started to feel itching... took benadryl and zyrtec each day she was there.  Noted still some mild itching on face.  Using aquaphor.   Next trip to [place] is 2/10  Wt Readings from Last 3 Encounters:  09/01/21 275 lb 8 oz (125 kg)  08/10/21 277 lb (125.6 kg)  07/18/21 280 lb (127 kg)       Episodes may have occured after eating [place] sausage.  Or she thinks possibly due to the water being treated.    Relevant past medical, surgical, family and social history reviewed and updated as indicated. Interim medical history since our last visit reviewed. Allergies and medications reviewed and updated. Outpatient Medications Prior to Visit  Medication Sig Dispense Refill   acyclovir (ZOVIRAX) 800 MG tablet Take 1 tablet (800 mg total) by mouth 2 (two) times daily. 10 tablet 12   amLODipine (NORVASC) 10 MG tablet Take 1 tablet (10 mg total) by mouth daily. 90 tablet 3   aspirin 81 MG tablet Take 81 mg by mouth daily.     glimepiride (AMARYL) 4 MG tablet TAKE 2 TABLETS BY MOUTH ONCE DAILY WITH BREAKFAST 180  tablet 2   hydrochlorothiazide (HYDRODIURIL) 25 MG tablet Take 1 tablet (25 mg total) by mouth daily. 90 tablet 3   ibuprofen (ADVIL,MOTRIN) 800 MG tablet Take 1 tablet (800 mg total) by mouth every 8 (eight) hours as needed for moderate pain. 21 tablet 0   losartan (COZAAR) 100 MG tablet Take 1 tablet (100 mg total) by mouth daily. 90 tablet 3   metFORMIN (GLUCOPHAGE-XR) 500 MG 24 hr tablet TAKE 4 TABLETS BY MOUTH ONCE DAILY WITH BREAKFAST 360 tablet 3   rosuvastatin (CRESTOR) 40 MG tablet Take 1 tablet by mouth once daily 90 tablet 0   pioglitazone (ACTOS) 30 MG tablet Take 1 tablet (30 mg total) by mouth daily. (Patient not taking: Reported on 09/01/2021) 90 tablet 3   cephALEXin (KEFLEX) 500 MG capsule Take 1 capsule (500 mg total) by mouth 4 (four) times daily. 20 capsule 0   Cholecalciferol (VITAMIN D3) 1.25 MG (50000 UT) CAPS Take 1 capsule by mouth every 7 (seven) days. 12 capsule 0   meloxicam (MOBIC) 15 MG tablet Take 1 tablet (15 mg total) by mouth daily. 30 tablet 3   ondansetron (ZOFRAN ODT) 4 MG disintegrating tablet Take 1 tablet (4 mg total) by mouth every 8 (eight) hours  as needed for nausea or vomiting. 20 tablet 0   predniSONE (DELTASONE) 20 MG tablet 3 tabs by mouth daily x 3 days, then 2 tabs by mouth daily x 2 days then 1 tab by mouth daily x 2 days 15 tablet 0   No facility-administered medications prior to visit.     Per HPI unless specifically indicated in ROS section below Review of Systems  Constitutional:  Negative for fatigue and fever.  HENT:  Negative for congestion.   Eyes:  Negative for pain.  Respiratory:  Negative for cough and shortness of breath.   Cardiovascular:  Negative for chest pain, palpitations and leg swelling.  Gastrointestinal:  Negative for abdominal pain.  Genitourinary:  Negative for dysuria and vaginal bleeding.  Musculoskeletal:  Negative for back pain.  Neurological:  Negative for syncope, light-headedness and headaches.   Psychiatric/Behavioral:  Negative for dysphoric mood.   Objective:  BP 140/90    Pulse 89    Temp 98 F (36.7 C) (Temporal)    Ht 5' 5.5" (1.664 m)    Wt 275 lb 8 oz (125 kg)    LMP 02/07/2012    SpO2 98%    BMI 45.15 kg/m   Wt Readings from Last 3 Encounters:  09/01/21 275 lb 8 oz (125 kg)  08/10/21 277 lb (125.6 kg)  07/18/21 280 lb (127 kg)      Physical Exam Constitutional:      General: She is not in acute distress.    Appearance: Normal appearance. She is well-developed. She is obese. She is not ill-appearing or toxic-appearing.  HENT:     Head: Normocephalic.     Right Ear: Hearing, tympanic membrane, ear canal and external ear normal. Tympanic membrane is not erythematous, retracted or bulging.     Left Ear: Hearing, tympanic membrane, ear canal and external ear normal. Tympanic membrane is not erythematous, retracted or bulging.     Nose: No mucosal edema or rhinorrhea.     Right Sinus: No maxillary sinus tenderness or frontal sinus tenderness.     Left Sinus: No maxillary sinus tenderness or frontal sinus tenderness.     Mouth/Throat:     Pharynx: Uvula midline.  Eyes:     General: Lids are normal. Lids are everted, no foreign bodies appreciated.     Conjunctiva/sclera: Conjunctivae normal.     Pupils: Pupils are equal, round, and reactive to light.  Neck:     Thyroid: No thyroid mass or thyromegaly.     Vascular: No carotid bruit.     Trachea: Trachea normal.  Cardiovascular:     Rate and Rhythm: Normal rate and regular rhythm.     Pulses: Normal pulses.     Heart sounds: Normal heart sounds, S1 normal and S2 normal. No murmur heard.   No friction rub. No gallop.  Pulmonary:     Effort: Pulmonary effort is normal. No tachypnea or respiratory distress.     Breath sounds: Normal breath sounds. No decreased breath sounds, wheezing, rhonchi or rales.  Abdominal:     General: Bowel sounds are normal.     Palpations: Abdomen is soft.     Tenderness: There is no  abdominal tenderness.  Musculoskeletal:     Cervical back: Normal range of motion and neck supple.  Skin:    General: Skin is warm and dry.     Findings: No rash.  Neurological:     Mental Status: She is alert.  Psychiatric:  Mood and Affect: Mood is not anxious or depressed.        Speech: Speech normal.        Behavior: Behavior normal. Behavior is cooperative.        Thought Content: Thought content normal.        Judgment: Judgment normal.      Results for orders placed or performed during the hospital encounter of 07/18/21  Urinalysis, Routine w reflex microscopic Urine, Clean Catch  Result Value Ref Range   Color, Urine YELLOW YELLOW   APPearance CLOUDY (A) CLEAR   Specific Gravity, Urine 1.015 1.005 - 1.030   pH 6.0 5.0 - 8.0   Glucose, UA 100 (A) NEGATIVE mg/dL   Hgb urine dipstick SMALL (A) NEGATIVE   Bilirubin Urine NEGATIVE NEGATIVE   Ketones, ur NEGATIVE NEGATIVE mg/dL   Protein, ur 30 (A) NEGATIVE mg/dL   Nitrite NEGATIVE NEGATIVE   Leukocytes,Ua MODERATE (A) NEGATIVE  Urinalysis, Microscopic (reflex)  Result Value Ref Range   RBC / HPF 6-10 0 - 5 RBC/hpf   WBC, UA 21-50 0 - 5 WBC/hpf   Bacteria, UA MANY (A) NONE SEEN   Squamous Epithelial / LPF 11-20 0 - 5    This visit occurred during the SARS-CoV-2 public health emergency.  Safety protocols were in place, including screening questions prior to the visit, additional usage of staff PPE, and extensive cleaning of exam room while observing appropriate contact time as indicated for disinfecting solutions.   COVID 19 screen:  No recent travel or known exposure to COVID19 The patient denies respiratory symptoms of COVID 19 at this time. The importance of social distancing was discussed today.   Assessment and Plan    Problem List Items Addressed This Visit     Allergic reaction - Primary     Recurrent episodes when going to [place] to visit same lady she is a companion to.  ? Environmental allergy vs  food allergy.   She is on losartan but has no issue when she is here in [place]... if allergic reaction starts here .. stop ARB given association with angioedema.  When returning to [facility]... start prophylactic zyrtec and benadryl. Rx for prednisone taper given if angioedema returns.   offered allergy testing but she is unable to afford at this time.      Diabetes mellitus with no complication, poor control.    Due for re-eval... she is hopefull to have payment plan sent up through [facility] and will return for testing when this is arranged.      Meds ordered this encounter  Medications   predniSONE (DELTASONE) 20 MG tablet    Sig: 3 tabs by mouth daily x 3 days, then 2 tabs by mouth daily x 2 days then 1 tab by mouth daily x 2 days    Dispense:  15 tablet    Refill:  0     Eliezer Lofts, MD

## 2021-09-01 NOTE — Assessment & Plan Note (Addendum)
Due for re-eval... she is hopefull to have payment plan sent up through [facility] and will return for testing when this is arranged.

## 2021-09-01 NOTE — Assessment & Plan Note (Signed)
Recurrent episodes when going to [place] to visit same lady she is a companion to.  ? Environmental allergy vs food allergy.   She is on losartan but has no issue when she is here in [place]... if allergic reaction starts here .. stop ARB given association with angioedema.  When returning to [facility]... start prophylactic zyrtec and benadryl. Rx for prednisone taper given if angioedema returns.   offered allergy testing but she is unable to afford at this time.

## 2021-09-01 NOTE — Patient Instructions (Addendum)
Continue zyrtec daily now until until itching is gone.  Before going to [place].. Start  zyrtec as well ads benadryl at night.  If any lip swelling, fill rx for prednisone taper.  If the allergic reaction happens locally.Marland Kitchen stop losartan and call me.

## 2021-11-30 ENCOUNTER — Other Ambulatory Visit: Payer: Self-pay | Admitting: Family Medicine

## 2021-11-30 NOTE — Telephone Encounter (Signed)
Last office visit 09/01/21 for allergic reaction.  Last refilled 10/20/20 x 1 year on both medicaitons.  AVS states:  Return in about 6 weeks (around 10/13/2021) for  CPX with fasting labs prior. ?No future appointments.  Refill? ?

## 2021-12-01 ENCOUNTER — Other Ambulatory Visit: Payer: Self-pay | Admitting: Family Medicine

## 2021-12-01 ENCOUNTER — Encounter: Payer: Self-pay | Admitting: Family Medicine

## 2021-12-01 NOTE — Telephone Encounter (Signed)
Last office visit 09/01/21 for allergic reaction.  Last refilled 10/20/20 x 1 year on both medicaitons.  AVS states:  Return in about 6 weeks (around 10/13/2021) for  CPX with fasting labs prior. ?No future appointments.  Refill? ?

## 2021-12-01 NOTE — Telephone Encounter (Signed)
Refill request refused.  Message sent via MyChart to have patient make appointment.  Once she makes appointment prescription can be filled until appointment date. ?

## 2021-12-02 ENCOUNTER — Other Ambulatory Visit: Payer: Self-pay | Admitting: Family Medicine

## 2021-12-02 MED ORDER — HYDROCHLOROTHIAZIDE 25 MG PO TABS: 25.0000 mg | ORAL_TABLET | Freq: Every day | ORAL | 0 refills | Status: DC

## 2021-12-02 MED ORDER — GLIMEPIRIDE 4 MG PO TABS: ORAL_TABLET | ORAL | 0 refills | Status: DC

## 2021-12-02 MED ORDER — AMLODIPINE BESYLATE 10 MG PO TABS: 10.0000 mg | ORAL_TABLET | Freq: Every day | ORAL | 0 refills | Status: DC

## 2021-12-02 MED ORDER — LOSARTAN POTASSIUM 100 MG PO TABS: 100.0000 mg | ORAL_TABLET | Freq: Every day | ORAL | 0 refills | Status: DC

## 2021-12-02 MED ORDER — METFORMIN HCL ER 500 MG PO TB24
ORAL_TABLET | ORAL | 0 refills | Status: DC
Start: 2021-12-02 — End: 2022-01-12

## 2021-12-02 NOTE — Addendum Note (Signed)
Addended by: Kris Mouton on: 12/02/2021 01:37 PM ? ? Modules accepted: Orders ? ?

## 2021-12-09 ENCOUNTER — Telehealth: Payer: Self-pay | Admitting: *Deleted

## 2021-12-09 NOTE — Telephone Encounter (Signed)
Received fax from [facility] stating patient gets her medication through them for free.  Glimepiride 4 mg tablet is non-formulary and the are asking for an alternative prescription for glipizide.   ?

## 2021-12-11 MED ORDER — GLIPIZIDE 10 MG PO TABS: 10.0000 mg | ORAL_TABLET | Freq: Two times a day (BID) | ORAL | 11 refills | Status: DC

## 2021-12-11 NOTE — Addendum Note (Signed)
Addended byEliezer Lofts E on: 12/11/2021 01:11 PM ? ? Modules accepted: Orders ? ?

## 2021-12-11 NOTE — Telephone Encounter (Signed)
Will stop glimepiride and change to glipizide ?

## 2021-12-28 LAB — HM DIABETES FOOT EXAM

## 2022-01-12 ENCOUNTER — Ambulatory Visit (INDEPENDENT_AMBULATORY_CARE_PROVIDER_SITE_OTHER): Payer: No Typology Code available for payment source | Admitting: Family Medicine

## 2022-01-12 ENCOUNTER — Encounter: Payer: Self-pay | Admitting: Family Medicine

## 2022-01-12 VITALS — BP 140/80 | HR 96 | Temp 98.6°F | Ht 65.5 in | Wt 263.0 lb

## 2022-01-12 DIAGNOSIS — E1159 Type 2 diabetes mellitus with other circulatory complications: Secondary | ICD-10-CM

## 2022-01-12 DIAGNOSIS — I152 Hypertension secondary to endocrine disorders: Secondary | ICD-10-CM

## 2022-01-12 DIAGNOSIS — E119 Type 2 diabetes mellitus without complications: Secondary | ICD-10-CM

## 2022-01-12 DIAGNOSIS — E785 Hyperlipidemia, unspecified: Secondary | ICD-10-CM

## 2022-01-12 DIAGNOSIS — E1169 Type 2 diabetes mellitus with other specified complication: Secondary | ICD-10-CM

## 2022-01-12 LAB — COMPREHENSIVE METABOLIC PANEL
ALT: 24 U/L (ref 0–35)
AST: 21 U/L (ref 0–37)
Albumin: 4.3 g/dL (ref 3.5–5.2)
Alkaline Phosphatase: 55 U/L (ref 39–117)
BUN: 12 mg/dL (ref 6–23)
CO2: 29 mEq/L (ref 19–32)
Calcium: 10.3 mg/dL (ref 8.4–10.5)
Chloride: 97 mEq/L (ref 96–112)
Creatinine, Ser: 0.78 mg/dL (ref 0.40–1.20)
GFR: 86.32 mL/min (ref >60.00)
Glucose, Bld: 222 mg/dL — ABNORMAL HIGH (ref 70–99)
Potassium: 3.6 mEq/L (ref 3.5–5.1)
Sodium: 134 mEq/L — ABNORMAL LOW (ref 135–145)
Total Bilirubin: 0.5 mg/dL (ref 0.2–1.2)
Total Protein: 7.9 g/dL (ref 6.0–8.3)

## 2022-01-12 LAB — POCT GLYCOSYLATED HEMOGLOBIN (HGB A1C): Hemoglobin A1C: 10 % — AB (ref 4.0–5.6)

## 2022-01-12 LAB — LIPID PANEL
Cholesterol: 197 mg/dL (ref 0–200)
HDL: 38.3 mg/dL — ABNORMAL LOW (ref >39.00)
LDL Cholesterol: 134 mg/dL — ABNORMAL HIGH (ref 0–99)
NonHDL: 158.48
Total CHOL/HDL Ratio: 5
Triglycerides: 121 mg/dL (ref 0.0–149.0)
VLDL: 24.2 mg/dL (ref 0.0–40.0)

## 2022-01-12 MED ORDER — GLIPIZIDE ER 10 MG PO TB24: 10.0000 mg | ORAL_TABLET | Freq: Every day | ORAL | 3 refills | Status: DC

## 2022-01-12 MED ORDER — METFORMIN HCL ER 500 MG PO TB24: ORAL_TABLET | ORAL | 3 refills | Status: DC

## 2022-01-12 MED ORDER — AMLODIPINE BESYLATE 10 MG PO TABS: 10.0000 mg | ORAL_TABLET | Freq: Every day | ORAL | 3 refills | Status: DC

## 2022-01-12 MED ORDER — ROSUVASTATIN CALCIUM 40 MG PO TABS
40.0000 mg | ORAL_TABLET | Freq: Every day | ORAL | 3 refills | Status: DC
Start: 2022-01-12 — End: 2024-05-29

## 2022-01-12 MED ORDER — LOSARTAN POTASSIUM 100 MG PO TABS: 100.0000 mg | ORAL_TABLET | Freq: Every day | ORAL | 3 refills | Status: DC

## 2022-01-12 MED ORDER — PIOGLITAZONE HCL 30 MG PO TABS: 30.0000 mg | ORAL_TABLET | Freq: Every day | ORAL | 3 refills | Status: DC

## 2022-01-12 MED ORDER — HYDROCHLOROTHIAZIDE 25 MG PO TABS: 25.0000 mg | ORAL_TABLET | Freq: Every day | ORAL | 3 refills | Status: DC

## 2022-01-12 NOTE — Assessment & Plan Note (Signed)
Chronic , poor control  She has significant financial issues, cannot find a job.  HAs been set up with Med assist... changed  glimperide to glipizide BID but given doing 6 hr intermittent fasting... she cannot use BID.  Rx for glucotrol XL sent in.  Will add back Actos 30 mg daily and re-eval in 3 months with A1C  She has eye exam scheduled and foot exam done today.

## 2022-01-12 NOTE — Progress Notes (Signed)
Patient ID: Courtney Cox, female    DOB: 24-Dec-1965, 56 y.o.   MRN: [id number]  This visit was conducted in person.  BP 140/80   Pulse 96   Temp 98.6 F (37 C) (Oral)   Ht 5' 5.5" (1.664 m)   Wt 263 lb (119.3 kg)   LMP 02/07/2012   SpO2 95%   BMI 43.10 kg/m    CC:  Chief Complaint  Patient presents with   Diabetes    Subjective:   HPI: Courtney Cox is a 56 y.o. female presenting on 01/12/2022 for Diabetes   Now on Med Assist.  Diabetes: Poor control, despite glipizide  10 mg BID, metformin XR 2000 mg daily  Not taking actos.. in 2021 looked in GLP1 and SGLT2 I but was to costly. Referred to [facility] pharmacist.. was not able to see because not [facility] patient. Lab Results  Component Value Date   HGBA1C 10.0 (A) 01/12/2022  Using medications without difficulties: Hypoglycemic episodes: none Hyperglycemic episodes: yes Feet problems: no ulcers Blood Sugars averaging: FBS   eye exam within last year: scheduled    Hypertension:   Borderline  control on losartan 100 mg daily, HCTZ 25 mng daily, amlodipine 10 mg daily BP Readings from Last 3 Encounters:  01/12/22 140/80  09/01/21 140/90  08/10/21 (!) 158/91  Using medication without problems or lightheadedness:  none Chest pain with exertion: none Edema: none Short of breath: none Average home BPs: Other issues:  Elevated Cholesterol: Due for re-eval. On Crestor 40 mg daily Using medications without problems: Muscle aches:  Diet compliance: Exercise: Other complaints:   She has lost 10 lbs in last 3 months  She is doing intermittent fasting. Wt Readings from Last 3 Encounters:  01/12/22 263 lb (119.3 kg)  09/01/21 275 lb 8 oz (125 kg)  08/10/21 277 lb (125.6 kg)   Body mass index is 43.1 kg/m.   Relevant past medical, surgical, family and social history reviewed and updated as indicated. Interim medical history since our last visit reviewed. Allergies and medications reviewed and updated. Outpatient  Medications Prior to Visit  Medication Sig Dispense Refill   acyclovir (ZOVIRAX) 800 MG tablet Take 1 tablet (800 mg total) by mouth 2 (two) times daily. 10 tablet 12   amLODipine (NORVASC) 10 MG tablet Take 1 tablet (10 mg total) by mouth daily. 90 tablet 0   aspirin 81 MG tablet Take 81 mg by mouth daily.     glipiZIDE (GLUCOTROL) 10 MG tablet Take 1 tablet (10 mg total) by mouth 2 (two) times daily before a meal. 60 tablet 11   hydrochlorothiazide (HYDRODIURIL) 25 MG tablet Take 1 tablet (25 mg total) by mouth daily. 90 tablet 0   losartan (COZAAR) 100 MG tablet Take 1 tablet (100 mg total) by mouth daily. 90 tablet 0   metFORMIN (GLUCOPHAGE-XR) 500 MG 24 hr tablet TAKE 4 TABLETS BY MOUTH ONCE DAILY WITH BREAKFAST 360 tablet 0   rosuvastatin (CRESTOR) 40 MG tablet Take 1 tablet by mouth once daily 90 tablet 0   ibuprofen (ADVIL,MOTRIN) 800 MG tablet Take 1 tablet (800 mg total) by mouth every 8 (eight) hours as needed for moderate pain. 21 tablet 0   predniSONE (DELTASONE) 20 MG tablet 3 tabs by mouth daily x 3 days, then 2 tabs by mouth daily x 2 days then 1 tab by mouth daily x 2 days 15 tablet 0   pioglitazone (ACTOS) 30 MG tablet Take 1 tablet (30 mg total)  by mouth daily. (Patient not taking: Reported on 01/12/2022) 90 tablet 3   No facility-administered medications prior to visit.     Per HPI unless specifically indicated in ROS section below Review of Systems  Constitutional:  Negative for fatigue and fever.  HENT:  Negative for congestion.   Eyes:  Negative for pain.  Respiratory:  Negative for cough and shortness of breath.   Cardiovascular:  Negative for chest pain, palpitations and leg swelling.  Gastrointestinal:  Negative for abdominal pain.  Genitourinary:  Negative for dysuria and vaginal bleeding.  Musculoskeletal:  Negative for back pain.  Neurological:  Negative for syncope, light-headedness and headaches.  Psychiatric/Behavioral:  Negative for dysphoric mood.    Objective:  BP 140/80   Pulse 96   Temp 98.6 F (37 C) (Oral)   Ht 5' 5.5" (1.664 m)   Wt 263 lb (119.3 kg)   LMP 02/07/2012   SpO2 95%   BMI 43.10 kg/m   Wt Readings from Last 3 Encounters:  01/12/22 263 lb (119.3 kg)  09/01/21 275 lb 8 oz (125 kg)  08/10/21 277 lb (125.6 kg)      Physical Exam Constitutional:      General: She is not in acute distress.    Appearance: Normal appearance. She is well-developed. She is not ill-appearing or toxic-appearing.  HENT:     Head: Normocephalic.     Right Ear: Hearing, tympanic membrane, ear canal and external ear normal. Tympanic membrane is not erythematous, retracted or bulging.     Left Ear: Hearing, tympanic membrane, ear canal and external ear normal. Tympanic membrane is not erythematous, retracted or bulging.     Nose: No mucosal edema or rhinorrhea.     Right Sinus: No maxillary sinus tenderness or frontal sinus tenderness.     Left Sinus: No maxillary sinus tenderness or frontal sinus tenderness.     Mouth/Throat:     Pharynx: Uvula midline.  Eyes:     General: Lids are normal. Lids are everted, no foreign bodies appreciated.     Conjunctiva/sclera: Conjunctivae normal.     Pupils: Pupils are equal, round, and reactive to light.  Neck:     Thyroid: No thyroid mass or thyromegaly.     Vascular: No carotid bruit.     Trachea: Trachea normal.  Cardiovascular:     Rate and Rhythm: Normal rate and regular rhythm.     Pulses: Normal pulses.     Heart sounds: Normal heart sounds, S1 normal and S2 normal. No murmur heard.   No friction rub. No gallop.  Pulmonary:     Effort: Pulmonary effort is normal. No tachypnea or respiratory distress.     Breath sounds: Normal breath sounds. No decreased breath sounds, wheezing, rhonchi or rales.  Abdominal:     General: Bowel sounds are normal.     Palpations: Abdomen is soft.     Tenderness: There is no abdominal tenderness.  Musculoskeletal:     Cervical back: Normal range of  motion and neck supple.  Skin:    General: Skin is warm and dry.     Findings: No rash.  Neurological:     Mental Status: She is alert.  Psychiatric:        Mood and Affect: Mood is not anxious or depressed.        Speech: Speech normal.        Behavior: Behavior normal. Behavior is cooperative.        Thought Content: Thought content normal.  Judgment: Judgment normal.      Diabetic foot exam: Normal inspection No skin breakdown No calluses  Normal DP pulses Normal sensation to light touch and monofilament Nails normal  Results for orders placed or performed in visit on 01/12/22  POCT glycosylated hemoglobin (Hb A1C)  Result Value Ref Range   Hemoglobin A1C 10.0 (A) 4.0 - 5.6 %   HbA1c POC (<> result, manual entry)     HbA1c, POC (prediabetic range)     HbA1c, POC (controlled diabetic range)       COVID 19 screen:  No recent travel or known exposure to COVID19 The patient denies respiratory symptoms of COVID 19 at this time. The importance of social distancing was discussed today.   Assessment and Plan Problem List Items Addressed This Visit     Diabetes mellitus with no complication, poor control. - Primary    Chronic , poor control  She has significant financial issues, cannot find a job.  HAs been set up with Med assist... changed  glimperide to glipizide BID but given doing 6 hr intermittent fasting... she cannot use BID.  Rx for glucotrol XL sent in.  Will add back Actos 30 mg daily and re-eval in 3 months with A1C  She has eye exam scheduled and foot exam done today.       Relevant Medications   glipiZIDE (GLUCOTROL XL) 10 MG 24 hr tablet   rosuvastatin (CRESTOR) 40 MG tablet   pioglitazone (ACTOS) 30 MG tablet   metFORMIN (GLUCOPHAGE-XR) 500 MG 24 hr tablet   losartan (COZAAR) 100 MG tablet   Other Relevant Orders   POCT glycosylated hemoglobin (Hb A1C) (Completed)   Lipid panel   Comprehensive metabolic panel   Hyperlipidemia associated with  type 2 diabetes mellitus (HCC)     Chronic, diue for re-eval on crestor 40 mg daily       Relevant Medications   glipiZIDE (GLUCOTROL XL) 10 MG 24 hr tablet   rosuvastatin (CRESTOR) 40 MG tablet   pioglitazone (ACTOS) 30 MG tablet   metFORMIN (GLUCOPHAGE-XR) 500 MG 24 hr tablet   losartan (COZAAR) 100 MG tablet   hydrochlorothiazide (HYDRODIURIL) 25 MG tablet   amLODipine (NORVASC) 10 MG tablet   Hypertension associated with diabetes (HCC)    Stable, chronic.  Continue current medication.   losartan 100 mg daily, HCTZ 25 mng daily, amlodipine 10 mg daily      Relevant Medications   glipiZIDE (GLUCOTROL XL) 10 MG 24 hr tablet   rosuvastatin (CRESTOR) 40 MG tablet   pioglitazone (ACTOS) 30 MG tablet   metFORMIN (GLUCOPHAGE-XR) 500 MG 24 hr tablet   losartan (COZAAR) 100 MG tablet   hydrochlorothiazide (HYDRODIURIL) 25 MG tablet   amLODipine (NORVASC) 10 MG tablet   Morbid obesity (HCC)    Encouraged exercise, weight loss, healthy eating habits.  She has been losing weight with intermittent fasting.. 10 lbs in last 3 months, > 60 in last several years.       Relevant Medications   glipiZIDE (GLUCOTROL XL) 10 MG 24 hr tablet   pioglitazone (ACTOS) 30 MG tablet   metFORMIN (GLUCOPHAGE-XR) 500 MG 24 hr tablet   Meds ordered this encounter  Medications   glipiZIDE (GLUCOTROL XL) 10 MG 24 hr tablet    Sig: Take 1 tablet (10 mg total) by mouth daily with breakfast.    Dispense:  90 tablet    Refill:  3   rosuvastatin (CRESTOR) 40 MG tablet    Sig: Take 1  tablet (40 mg total) by mouth daily.    Dispense:  90 tablet    Refill:  3   pioglitazone (ACTOS) 30 MG tablet    Sig: Take 1 tablet (30 mg total) by mouth daily.    Dispense:  90 tablet    Refill:  3   metFORMIN (GLUCOPHAGE-XR) 500 MG 24 hr tablet    Sig: TAKE 4 TABLETS BY MOUTH ONCE DAILY WITH BREAKFAST    Dispense:  360 tablet    Refill:  3   losartan (COZAAR) 100 MG tablet    Sig: Take 1 tablet (100 mg total) by  mouth daily.    Dispense:  90 tablet    Refill:  3   hydrochlorothiazide (HYDRODIURIL) 25 MG tablet    Sig: Take 1 tablet (25 mg total) by mouth daily.    Dispense:  90 tablet    Refill:  3   amLODipine (NORVASC) 10 MG tablet    Sig: Take 1 tablet (10 mg total) by mouth daily.    Dispense:  90 tablet    Refill:  3    Orders Placed This Encounter  Procedures   Lipid panel   Comprehensive metabolic panel   POCT glycosylated hemoglobin (Hb A1C)   [facility] DIABETES FOOT EXAM    This external order was created through the Results Console.       Eliezer Lofts, MD

## 2022-01-12 NOTE — Assessment & Plan Note (Signed)
Stable, chronic.  Continue current medication.   losartan 100 mg daily, HCTZ 25 mng daily, amlodipine 10 mg daily

## 2022-01-12 NOTE — Assessment & Plan Note (Signed)
Chronic, diue for re-eval on crestor 40 mg daily

## 2022-01-12 NOTE — Patient Instructions (Addendum)
Keep yearly eye exam for diabetes and have the opthalmologist send [place] a copy of the evaluation for the chart. Continue intermittent fasting.  I will send in Glucotrol XL 10 mg daily for you to take 30 min prior to first meal of the day.  Start Actos 30 mg daily.  Please stop at the lab to have labs drawn.

## 2022-01-12 NOTE — Assessment & Plan Note (Signed)
Encouraged exercise, weight loss, healthy eating habits.  She has been losing weight with intermittent fasting.. 10 lbs in last 3 months, > 60 in last several years.

## 2022-01-21 LAB — HM DIABETES EYE EXAM

## 2022-02-12 ENCOUNTER — Encounter: Payer: Self-pay | Admitting: Family Medicine

## 2022-04-23 ENCOUNTER — Encounter: Payer: No Typology Code available for payment source | Admitting: Family Medicine

## 2022-08-11 DIAGNOSIS — Z1211 Encounter for screening for malignant neoplasm of colon: Secondary | ICD-10-CM | POA: Diagnosis not present

## 2022-08-11 DIAGNOSIS — E1337X3 Other specified diabetes mellitus with diabetic macular edema, resolved following treatment, bilateral: Secondary | ICD-10-CM | POA: Diagnosis not present

## 2022-08-11 DIAGNOSIS — Z1231 Encounter for screening mammogram for malignant neoplasm of breast: Secondary | ICD-10-CM | POA: Diagnosis not present

## 2022-08-11 DIAGNOSIS — E118 Type 2 diabetes mellitus with unspecified complications: Secondary | ICD-10-CM | POA: Diagnosis not present

## 2022-08-12 DIAGNOSIS — E7800 Pure hypercholesterolemia, unspecified: Secondary | ICD-10-CM | POA: Diagnosis not present

## 2022-08-12 DIAGNOSIS — E118 Type 2 diabetes mellitus with unspecified complications: Secondary | ICD-10-CM | POA: Diagnosis not present

## 2022-08-12 DIAGNOSIS — I10 Essential (primary) hypertension: Secondary | ICD-10-CM | POA: Diagnosis not present

## 2022-08-18 ENCOUNTER — Encounter: Payer: Self-pay | Admitting: Oncology

## 2022-08-25 ENCOUNTER — Telehealth: Payer: Self-pay

## 2022-08-25 ENCOUNTER — Other Ambulatory Visit: Payer: Self-pay

## 2022-08-25 DIAGNOSIS — Z1211 Encounter for screening for malignant neoplasm of colon: Secondary | ICD-10-CM

## 2022-08-25 MED ORDER — NA SULFATE-K SULFATE-MG SULF 17.5-3.13-1.6 GM/177ML PO SOLN: 1.0000 | Freq: Once | ORAL | 0 refills | Status: AC

## 2022-08-25 NOTE — Telephone Encounter (Signed)
Gastroenterology Pre-Procedure Review  Request Date: 11/17/22 Requesting Physician: Dr. Marius Ditch  PATIENT REVIEW QUESTIONS: The patient responded to the following health history questions as indicated:    1. Are you having any GI issues? no 2. Do you have a personal history of Polyps? no 3. Do you have a family history of Colon Cancer or Polyps? yes (paternal grandfather colon cancer) 91. Diabetes Mellitus? yes (Medication Reminder: Stop Ozempic on 11/10/22.  Stop Glipizide, Metformin, and Actos on 11/14/22.  Noted on instructions) 5. Joint replacements in the past 12 months?no 6. Major health problems in the past 3 months?no 7. Any artificial heart valves, MVP, or defibrillator?no    MEDICATIONS & ALLERGIES:    Patient reports the following regarding taking any anticoagulation/antiplatelet therapy:   Plavix, Coumadin, Eliquis, Xarelto, Lovenox, Pradaxa, Brilinta, or Effient? no Aspirin? '81mg'$   Patient confirms/reports the following medications:  Current Outpatient Medications  Medication Sig Dispense Refill   acyclovir (ZOVIRAX) 800 MG tablet Take 1 tablet (800 mg total) by mouth 2 (two) times daily. 10 tablet 12   amLODipine (NORVASC) 10 MG tablet Take 1 tablet (10 mg total) by mouth daily. 90 tablet 3   aspirin 81 MG tablet Take 81 mg by mouth daily.     glipiZIDE (GLUCOTROL XL) 10 MG 24 hr tablet Take 1 tablet (10 mg total) by mouth daily with breakfast. 90 tablet 3   hydrochlorothiazide (HYDRODIURIL) 25 MG tablet Take 1 tablet (25 mg total) by mouth daily. 90 tablet 3   losartan (COZAAR) 100 MG tablet Take 1 tablet (100 mg total) by mouth daily. 90 tablet 3   metFORMIN (GLUCOPHAGE-XR) 500 MG 24 hr tablet TAKE 4 TABLETS BY MOUTH ONCE DAILY WITH BREAKFAST 360 tablet 3   pioglitazone (ACTOS) 30 MG tablet Take 1 tablet (30 mg total) by mouth daily. 90 tablet 3   rosuvastatin (CRESTOR) 40 MG tablet Take 1 tablet (40 mg total) by mouth daily. 90 tablet 3   No current facility-administered  medications for this visit.    Patient confirms/reports the following allergies:  No Known Allergies  No orders of the defined types were placed in this encounter.   AUTHORIZATION INFORMATION Primary Insurance: 1D#: Group #:  Secondary Insurance: 1D#: Group #:  SCHEDULE INFORMATION: Date: 11/17/22 Time: Location: [facility]

## 2022-09-01 DIAGNOSIS — Z1231 Encounter for screening mammogram for malignant neoplasm of breast: Secondary | ICD-10-CM | POA: Diagnosis not present

## 2022-09-01 LAB — HM MAMMOGRAPHY

## 2022-09-30 ENCOUNTER — Telehealth: Payer: Self-pay | Admitting: Pharmacist

## 2022-09-30 NOTE — Progress Notes (Signed)
Attempted to contact patient for medication management in light of transition to Managed Medicaid and elevated A1c. Patient notes that she didn't think [facility] took [place], so she has established with another provider in [place], though she would have preferred to stay with Dr. Diona Browner.   She will think about it and give the office a call if she decided to change back.   Catie Hedwig Morton, PharmD, [place], [facility] [phone]

## 2022-10-19 ENCOUNTER — Telehealth: Payer: Self-pay | Admitting: Gastroenterology

## 2022-10-19 DIAGNOSIS — Z1211 Encounter for screening for malignant neoplasm of colon: Secondary | ICD-10-CM

## 2022-10-19 NOTE — Telephone Encounter (Signed)
Patient calling wanting to reschedule colonoscopy.

## 2022-10-19 NOTE — Telephone Encounter (Signed)
Patients call has been returned.  She has been  rescheduled for 12/20/22 with Dr. Marius Ditch.  I will notify the endoscopy dept of her date change tomorrow morning and update her instructions.  Thanks, [place], [place]

## 2022-10-20 ENCOUNTER — Encounter: Payer: Self-pay | Admitting: Oncology

## 2022-10-20 NOTE — Telephone Encounter (Signed)
Endoscopy has been notified of date change to 12/20/22. New instructions printed and sent to mychart.  Referral updated.  Thanks, [place], [place]

## 2022-10-22 ENCOUNTER — Encounter: Payer: Self-pay | Admitting: Oncology

## 2022-11-25 DIAGNOSIS — I10 Essential (primary) hypertension: Secondary | ICD-10-CM | POA: Diagnosis not present

## 2022-11-25 DIAGNOSIS — E118 Type 2 diabetes mellitus with unspecified complications: Secondary | ICD-10-CM | POA: Diagnosis not present

## 2022-12-01 LAB — HM DIABETES EYE EXAM

## 2022-12-02 DIAGNOSIS — H5213 Myopia, bilateral: Secondary | ICD-10-CM | POA: Diagnosis not present

## 2022-12-19 ENCOUNTER — Emergency Department (HOSPITAL_COMMUNITY)
Admission: EM | Admit: 2022-12-19 | Discharge: 2022-12-19 | Disposition: A | Discharge status: Home / Self Care | Payer: Medicaid Other | Attending: Emergency Medicine | Admitting: Emergency Medicine

## 2022-12-19 ENCOUNTER — Other Ambulatory Visit: Payer: Self-pay

## 2022-12-19 ENCOUNTER — Emergency Department (HOSPITAL_COMMUNITY): Payer: Medicaid Other

## 2022-12-19 ENCOUNTER — Encounter (HOSPITAL_COMMUNITY): Payer: Self-pay | Admitting: Emergency Medicine

## 2022-12-19 DIAGNOSIS — R1032 Left lower quadrant pain: Secondary | ICD-10-CM | POA: Diagnosis not present

## 2022-12-19 DIAGNOSIS — E860 Dehydration: Secondary | ICD-10-CM | POA: Insufficient documentation

## 2022-12-19 DIAGNOSIS — E119 Type 2 diabetes mellitus without complications: Secondary | ICD-10-CM | POA: Insufficient documentation

## 2022-12-19 DIAGNOSIS — Z7982 Long term (current) use of aspirin: Secondary | ICD-10-CM | POA: Diagnosis not present

## 2022-12-19 DIAGNOSIS — Z79899 Other long term (current) drug therapy: Secondary | ICD-10-CM | POA: Diagnosis not present

## 2022-12-19 DIAGNOSIS — K760 Fatty (change of) liver, not elsewhere classified: Secondary | ICD-10-CM | POA: Diagnosis not present

## 2022-12-19 DIAGNOSIS — I10 Essential (primary) hypertension: Secondary | ICD-10-CM | POA: Insufficient documentation

## 2022-12-19 DIAGNOSIS — Z7984 Long term (current) use of oral hypoglycemic drugs: Secondary | ICD-10-CM | POA: Diagnosis not present

## 2022-12-19 DIAGNOSIS — R109 Unspecified abdominal pain: Secondary | ICD-10-CM | POA: Diagnosis not present

## 2022-12-19 LAB — URINALYSIS, ROUTINE W REFLEX MICROSCOPIC
Bacteria, UA: NONE SEEN
Bilirubin Urine: NEGATIVE
Glucose, UA: NEGATIVE mg/dL
Hgb urine dipstick: NEGATIVE
Ketones, ur: 5 mg/dL — AB
Leukocytes,Ua: NEGATIVE
Nitrite: NEGATIVE
Protein, ur: 30 mg/dL — AB
Specific Gravity, Urine: 1.033 — ABNORMAL HIGH (ref 1.005–1.030)
pH: 5 (ref 5.0–8.0)

## 2022-12-19 LAB — COMPREHENSIVE METABOLIC PANEL
ALT: 17 U/L (ref 0–44)
AST: 15 U/L (ref 15–41)
Albumin: 3.7 g/dL (ref 3.5–5.0)
Alkaline Phosphatase: 49 U/L (ref 38–126)
Anion gap: 9 (ref 5–15)
BUN: 12 mg/dL (ref 6–20)
CO2: 24 mmol/L (ref 22–32)
Calcium: 9.3 mg/dL (ref 8.9–10.3)
Chloride: 103 mmol/L (ref 98–111)
Creatinine, Ser: 0.81 mg/dL (ref 0.44–1.00)
GFR, Estimated: >60 mL/min (ref >60)
Glucose, Bld: 202 mg/dL — ABNORMAL HIGH (ref 70–99)
Potassium: 3.7 mmol/L (ref 3.5–5.1)
Sodium: 136 mmol/L (ref 135–145)
Total Bilirubin: 0.6 mg/dL (ref 0.3–1.2)
Total Protein: 7.4 g/dL (ref 6.5–8.1)

## 2022-12-19 LAB — CBC WITH DIFFERENTIAL/PLATELET
Abs Immature Granulocytes: 0.01 10*3/uL (ref 0.00–0.07)
Basophils Absolute: 0 10*3/uL (ref 0.0–0.1)
Basophils Relative: 0 %
Eosinophils Absolute: 0.2 10*3/uL (ref 0.0–0.5)
Eosinophils Relative: 2 %
HCT: 41 % (ref 36.0–46.0)
Hemoglobin: 13.3 g/dL (ref 12.0–15.0)
Immature Granulocytes: 0 %
Lymphocytes Relative: 31 %
Lymphs Abs: 2.9 10*3/uL (ref 0.7–4.0)
MCH: 28.1 pg (ref 26.0–34.0)
MCHC: 32.4 g/dL (ref 30.0–36.0)
MCV: 86.7 fL (ref 80.0–100.0)
Monocytes Absolute: 0.7 10*3/uL (ref 0.1–1.0)
Monocytes Relative: 7 %
Neutro Abs: 5.4 10*3/uL (ref 1.7–7.7)
Neutrophils Relative %: 60 %
Platelets: 370 10*3/uL (ref 150–400)
RBC: 4.73 MIL/uL (ref 3.87–5.11)
RDW: 14.3 % (ref 11.5–15.5)
WBC: 9.1 10*3/uL (ref 4.0–10.5)
nRBC: 0 % (ref 0.0–0.2)

## 2022-12-19 LAB — LIPASE, BLOOD: Lipase: 34 U/L (ref 11–51)

## 2022-12-19 MED ORDER — DICYCLOMINE HCL 10 MG PO CAPS
10.0000 mg | ORAL_CAPSULE | Freq: Once | ORAL | Status: AC
  Administered 2022-12-19: 10 mg via ORAL
  Filled 2022-12-19: qty 1

## 2022-12-19 MED ORDER — IOHEXOL 300 MG/ML  SOLN
100.0000 mL | Freq: Once | INTRAMUSCULAR | Status: AC | PRN
  Administered 2022-12-19: 100 mL via INTRAVENOUS

## 2022-12-19 MED ORDER — SODIUM CHLORIDE 0.9 % IV BOLUS
500.0000 mL | Freq: Once | INTRAVENOUS | Status: AC
  Administered 2022-12-19: 500 mL via INTRAVENOUS

## 2022-12-19 MED ORDER — DICYCLOMINE HCL 20 MG PO TABS: 20.0000 mg | ORAL_TABLET | Freq: Two times a day (BID) | ORAL | 0 refills | Status: DC

## 2022-12-19 NOTE — Discharge Instructions (Signed)
Your lab test and your CT scan are negative for any acute findings or source of your abdominal discomfort.  This could be intestinal spasm or cramping which would not be seen on any lab tests or imaging studies but can be painful.  You have been prescribed a course of Bentyl which can help with abdominal pain and cramping.  I would recommend follow-up with your primary doctor if your symptoms are not resolved over the next week.

## 2022-12-19 NOTE — ED Provider Notes (Signed)
[place] EMERGENCY DEPARTMENT AT [facility] Provider Note   CSN: [id number] Arrival date & time: 12/19/22  4098     History {Add pertinent medical, surgical, social history, OB history to HPI:1} No chief complaint on file.   Courtney Cox is a 57 y.o. female with a history including type 2 diabetes, hypertension, hyperlipidemia presenting for evaluation of left lateral abdominal pain which has been constant x 3 days.  She describes an aching pain which does not resolve or worsen with any intervention.  It is not reproducible, not worse with movement.  She has had a reduced appetite but denies nausea or vomiting, has also had no fevers, no diarrhea.  She does endorse constipation since starting Ozempic in January.  Her last bowel movement was 2 days ago.  She has increased her fiber and fruit intake with no significant improvement in constipation.  She is concerned about pancreatitis as this is a side effect of Ozempic.  She denies history of this condition.  She denies chest pain, shortness of breath or any other complaints.  She has taken ibuprofen and Tylenol with no significant improvement in her pain.  The history is provided by the patient.       Home Medications Prior to Admission medications   Medication Sig Start Date End Date Taking? Authorizing Provider  acyclovir (ZOVIRAX) 800 MG tablet Take 1 tablet (800 mg total) by mouth 2 (two) times daily. 07/07/21   Wendi Snipes, FNP  amLODipine (NORVASC) 10 MG tablet Take 1 tablet (10 mg total) by mouth daily. 01/12/22   Bedsole, Amy E, MD  aspirin 81 MG tablet Take 81 mg by mouth daily.    [provider]  glipiZIDE (GLUCOTROL XL) 10 MG 24 hr tablet Take 1 tablet (10 mg total) by mouth daily with breakfast. 01/12/22   Bedsole, Amy E, MD  hydrochlorothiazide (HYDRODIURIL) 25 MG tablet Take 1 tablet (25 mg total) by mouth daily. 01/12/22   Bedsole, Amy E, MD  losartan (COZAAR) 100 MG tablet Take 1 tablet (100 mg  total) by mouth daily. 01/12/22   Bedsole, Amy E, MD  metFORMIN (GLUCOPHAGE-XR) 500 MG 24 hr tablet TAKE 4 TABLETS BY MOUTH ONCE DAILY WITH BREAKFAST 01/12/22   Bedsole, Amy E, MD  pioglitazone (ACTOS) 30 MG tablet Take 1 tablet (30 mg total) by mouth daily. 01/12/22   Bedsole, Amy E, MD  rosuvastatin (CRESTOR) 40 MG tablet Take 1 tablet (40 mg total) by mouth daily. 01/12/22   Bedsole, Amy E, MD  potassium chloride SA (K-DUR,KLOR-CON) 20 MEQ tablet Take 1 tablet (20 mEq total) by mouth 2 (two) times daily. 10/12/11 11/10/11  Drue Second, MD      Allergies    Patient has no known allergies.    Review of Systems   Review of Systems  Constitutional:  Positive for appetite change. Negative for chills and fever.  HENT:  Negative for congestion and sore throat.   Eyes: Negative.   Respiratory:  Negative for chest tightness and shortness of breath.   Cardiovascular:  Negative for chest pain.  Gastrointestinal:  Positive for abdominal pain and constipation. Negative for nausea and vomiting.  Genitourinary: Negative.  Negative for dysuria.  Musculoskeletal:  Negative for arthralgias, joint swelling and neck pain.  Skin: Negative.  Negative for rash and wound.  Neurological:  Negative for dizziness, weakness, light-headedness, numbness and headaches.  Psychiatric/Behavioral: Negative.    All other systems reviewed and are negative.   Physical Exam Updated Vital  Signs LMP 02/07/2012  Physical Exam Vitals and nursing note reviewed.  Constitutional:      Appearance: She is well-developed.  HENT:     Head: Normocephalic and atraumatic.  Eyes:     Conjunctiva/sclera: Conjunctivae normal.  Cardiovascular:     Rate and Rhythm: Normal rate and regular rhythm.     Heart sounds: Normal heart sounds.  Pulmonary:     Effort: Pulmonary effort is normal.     Breath sounds: Normal breath sounds. No wheezing.  Abdominal:     General: Bowel sounds are normal.     Palpations: Abdomen is soft.      Tenderness: There is no abdominal tenderness.     Comments: Abdominal pain is located in the left lateral mid abdomen but not reproducible on my exam.  No guarding, no rebound.  Normal bowel sounds present.  Musculoskeletal:        General: Normal range of motion.     Cervical back: Normal range of motion.  Skin:    General: Skin is warm and dry.  Neurological:     Mental Status: She is alert.     ED Results / Procedures / Treatments   Labs (all labs ordered are listed, but only abnormal results are displayed) Labs Reviewed - No data to display  EKG None  Radiology No results found.  Procedures Procedures  {Document cardiac monitor, telemetry assessment procedure when appropriate:1}  Medications Ordered in ED Medications - No data to display  ED Course/ Medical Decision Making/ A&P   {   Click here for ABCD2, HEART and other calculatorsREFRESH Note before signing :1}                          Medical Decision Making Amount and/or Complexity of Data Reviewed Labs: ordered. Radiology: ordered.  Risk Prescription drug management.     {Document critical care time when appropriate:1} {Document review of labs and clinical decision tools ie heart score, Chads2Vasc2 etc:1}  {Document your independent review of radiology images, and any outside records:1} {Document your discussion with family members, caretakers, and with consultants:1} {Document social determinants of health affecting pt's care:1} {Document your decision making why or why not admission, treatments were needed:1} Final Clinical Impression(s) / ED Diagnoses Final diagnoses:  None    Rx / DC Orders ED Discharge Orders     None

## 2022-12-19 NOTE — ED Triage Notes (Signed)
Pt reports LL abdominal pain X3 days. IBP is not working to relieve any pain. Pt denies injury. Pt denies fever, N/V.

## 2022-12-20 ENCOUNTER — Encounter: Admission: RE | Payer: Self-pay | Source: Home / Self Care

## 2022-12-20 ENCOUNTER — Telehealth: Payer: Self-pay

## 2022-12-20 ENCOUNTER — Ambulatory Visit: Admission: RE | Admit: 2022-12-20 | Payer: Medicaid Other | Source: Home / Self Care | Admitting: Gastroenterology

## 2022-12-20 SURGERY — COLONOSCOPY WITH PROPOFOL
Anesthesia: General

## 2022-12-20 NOTE — Transitions of Care (Post Inpatient/ED Visit) (Deleted)
   12/20/2022  Name: Courtney Cox MRN: [id number] DOB: August 17, 1966  {[id number]}

## 2022-12-21 ENCOUNTER — Telehealth: Payer: Self-pay

## 2022-12-21 NOTE — Telephone Encounter (Signed)
Pt last saw Dr Ermalene Searing 01/12/22 and no showed 04/23/22. I spoke with Rinaldo Cloud at Dr Abran Duke office and she said Dr Girtha Rm is pts PCP and was last seen 11/25/22. Dr Neita Carp office is not [facility] affiliated and is not in [employer]. Rinaldo Cloud asked me to fax the ED report from [facility]; I advised Ashok Croon would ck with nurse supervisor, Park Meo CMA  and if I could not fax then she could contact [facility] for ED record for pt on 12/19/22. I advised Rinaldo Cloud that since the ED visit did not originate in our offce;it is not our report and I cannot fax this info to [facility] office and for Rinaldo Cloud to contact [facility] medical records for them to fax the ED visit to their fax # [phone]. Rinaldo Cloud voiced understanding.

## 2023-01-31 DIAGNOSIS — L259 Unspecified contact dermatitis, unspecified cause: Secondary | ICD-10-CM | POA: Diagnosis not present

## 2023-01-31 DIAGNOSIS — Z6841 Body Mass Index (BMI) 40.0 and over, adult: Secondary | ICD-10-CM | POA: Diagnosis not present

## 2023-02-07 NOTE — Telephone Encounter (Signed)
error 

## 2023-02-07 NOTE — Telephone Encounter (Deleted)
error 

## 2023-03-03 ENCOUNTER — Other Ambulatory Visit: Payer: Self-pay | Admitting: Oncology

## 2023-03-03 DIAGNOSIS — Z006 Encounter for examination for normal comparison and control in clinical research program: Secondary | ICD-10-CM

## 2023-03-28 DIAGNOSIS — Z1211 Encounter for screening for malignant neoplasm of colon: Secondary | ICD-10-CM | POA: Diagnosis not present

## 2023-03-28 DIAGNOSIS — E782 Mixed hyperlipidemia: Secondary | ICD-10-CM | POA: Diagnosis not present

## 2023-03-28 DIAGNOSIS — I10 Essential (primary) hypertension: Secondary | ICD-10-CM | POA: Diagnosis not present

## 2023-03-28 DIAGNOSIS — L259 Unspecified contact dermatitis, unspecified cause: Secondary | ICD-10-CM | POA: Diagnosis not present

## 2023-03-28 DIAGNOSIS — E118 Type 2 diabetes mellitus with unspecified complications: Secondary | ICD-10-CM | POA: Diagnosis not present

## 2023-04-05 ENCOUNTER — Telehealth: Payer: Self-pay | Admitting: Family Medicine

## 2023-04-05 NOTE — Telephone Encounter (Signed)
Patient contacted the office requesting to re establish with Dr. Ermalene Searing, she had switched providers for insurance purposes. Patient would like to re establish with Dr. Ermalene Searing if possible. Last visit was May of 2023, ok to re- establish?

## 2023-04-18 ENCOUNTER — Other Ambulatory Visit: Payer: Self-pay | Admitting: Family Medicine

## 2023-04-19 ENCOUNTER — Encounter: Payer: Self-pay | Admitting: Family Medicine

## 2023-04-19 ENCOUNTER — Ambulatory Visit (INDEPENDENT_AMBULATORY_CARE_PROVIDER_SITE_OTHER): Payer: Medicaid Other | Admitting: Family Medicine

## 2023-04-19 VITALS — BP 120/70 | HR 107 | Temp 98.2°F | Ht 65.0 in | Wt 249.1 lb

## 2023-04-19 DIAGNOSIS — Z114 Encounter for screening for human immunodeficiency virus [HIV]: Secondary | ICD-10-CM

## 2023-04-19 DIAGNOSIS — E1169 Type 2 diabetes mellitus with other specified complication: Secondary | ICD-10-CM | POA: Diagnosis not present

## 2023-04-19 DIAGNOSIS — Z862 Personal history of diseases of the blood and blood-forming organs and certain disorders involving the immune mechanism: Secondary | ICD-10-CM | POA: Diagnosis not present

## 2023-04-19 DIAGNOSIS — B351 Tinea unguium: Secondary | ICD-10-CM

## 2023-04-19 DIAGNOSIS — Z7984 Long term (current) use of oral hypoglycemic drugs: Secondary | ICD-10-CM | POA: Diagnosis not present

## 2023-04-19 DIAGNOSIS — E119 Type 2 diabetes mellitus without complications: Secondary | ICD-10-CM | POA: Diagnosis not present

## 2023-04-19 DIAGNOSIS — E785 Hyperlipidemia, unspecified: Secondary | ICD-10-CM

## 2023-04-19 DIAGNOSIS — E538 Deficiency of other specified B group vitamins: Secondary | ICD-10-CM

## 2023-04-19 DIAGNOSIS — E559 Vitamin D deficiency, unspecified: Secondary | ICD-10-CM | POA: Diagnosis not present

## 2023-04-19 DIAGNOSIS — E1159 Type 2 diabetes mellitus with other circulatory complications: Secondary | ICD-10-CM | POA: Diagnosis not present

## 2023-04-19 DIAGNOSIS — I152 Hypertension secondary to endocrine disorders: Secondary | ICD-10-CM

## 2023-04-19 DIAGNOSIS — Z0000 Encounter for general adult medical examination without abnormal findings: Secondary | ICD-10-CM | POA: Diagnosis not present

## 2023-04-19 LAB — COMPREHENSIVE METABOLIC PANEL
ALT: 13 U/L (ref 0–35)
AST: 14 U/L (ref 0–37)
Albumin: 4.2 g/dL (ref 3.5–5.2)
Alkaline Phosphatase: 61 U/L (ref 39–117)
BUN: 11 mg/dL (ref 6–23)
CO2: 29 mEq/L (ref 19–32)
Calcium: 10.5 mg/dL (ref 8.4–10.5)
Chloride: 102 mEq/L (ref 96–112)
Creatinine, Ser: 0.88 mg/dL (ref 0.40–1.20)
GFR: 74.03 mL/min (ref >60.00)
Glucose, Bld: 127 mg/dL — ABNORMAL HIGH (ref 70–99)
Potassium: 4.6 mEq/L (ref 3.5–5.1)
Sodium: 141 mEq/L (ref 135–145)
Total Bilirubin: 0.5 mg/dL (ref 0.2–1.2)
Total Protein: 7.5 g/dL (ref 6.0–8.3)

## 2023-04-19 LAB — LIPID PANEL
Cholesterol: 156 mg/dL (ref 0–200)
HDL: 38.3 mg/dL — ABNORMAL LOW (ref >39.00)
LDL Cholesterol: 100 mg/dL — ABNORMAL HIGH (ref 0–99)
NonHDL: 118.06
Total CHOL/HDL Ratio: 4
Triglycerides: 88 mg/dL (ref 0.0–149.0)
VLDL: 17.6 mg/dL (ref 0.0–40.0)

## 2023-04-19 LAB — VITAMIN D 25 HYDROXY (VIT D DEFICIENCY, FRACTURES): VITD: 25.92 ng/mL — ABNORMAL LOW (ref 30.00–100.00)

## 2023-04-19 LAB — MICROALBUMIN / CREATININE URINE RATIO
Creatinine,U: 230.4 mg/dL
Microalb Creat Ratio: 1.3 mg/g (ref 0.0–30.0)
Microalb, Ur: 3 mg/dL — ABNORMAL HIGH (ref 0.0–1.9)

## 2023-04-19 LAB — HM DIABETES FOOT EXAM

## 2023-04-19 LAB — VITAMIN B12: Vitamin B-12: >1501 pg/mL — ABNORMAL HIGH (ref 211–911)

## 2023-04-19 LAB — HEMOGLOBIN A1C: Hgb A1c MFr Bld: 8.2 % — ABNORMAL HIGH (ref 4.6–6.5)

## 2023-04-19 MED ORDER — TERBINAFINE HCL 250 MG PO TABS: 250.0000 mg | ORAL_TABLET | Freq: Every day | ORAL | 0 refills | Status: DC

## 2023-04-19 NOTE — Assessment & Plan Note (Signed)
Due to fibroids , now resolved after hysterectomy

## 2023-04-19 NOTE — Assessment & Plan Note (Signed)
Due for lab reevaluation.

## 2023-04-19 NOTE — Assessment & Plan Note (Signed)
Stable, chronic.  Continue current medication.   losartan 100 mg daily, HCTZ 25 mng daily, amlodipine 10 mg daily

## 2023-04-19 NOTE — Assessment & Plan Note (Signed)
Dure for re-eval.

## 2023-04-19 NOTE — Patient Instructions (Signed)
Call to reschedule colonoscopy.  Please stop at the lab to have labs drawn.

## 2023-04-19 NOTE — Assessment & Plan Note (Signed)
Due for reevaluation 

## 2023-04-19 NOTE — Progress Notes (Signed)
Patient ID: MCKAY NOTZ, female    DOB: [birth year], 57 y.o.   MRN: [id number]  This visit was conducted in person.  BP 120/70 (BP Location: Right Arm, Patient Position: Sitting, Cuff Size: Large)   Pulse (!) 107   Temp 98.2 F (36.8 C) (Temporal)   Ht 5\' 5"  (1.651 m)   Wt 249 lb 2 oz (113 kg)   LMP 02/07/2012   SpO2 98%   BMI 41.46 kg/m    CC:  Chief Complaint  Patient presents with   Re-Establish Care    Subjective:   HPI: Courtney Cox is a 57 y.o. female presenting on 04/19/2023 for Re-Establish Care   Temporarily saw Dr. Suzette Battiest.  Started on ozempic.  The patient presents for complete physical and review of chronic health problems. She also has the following acute concerns today:   Has noted loss of urine.. incontinence  on the way to bathroom..  3 months. No frequency.   DM, high cholesterol, vitamin D and history of iron deficiency anemia due for lab reevaluation.    Has lost 20 lbs in last year on ozempic. Last increase 1-2 months ago to 2 mg weekly.  No SE to ozempic.  She has more energy since losing weight. Wt Readings from Last 3 Encounters:  04/19/23 249 lb 2 oz (113 kg)  12/19/22 250 lb (113.4 kg)  01/12/22 263 lb (119.3 kg)   FBS 120-149.   Hypertension:   losartan 100 mg daily, HCTZ 25 mng daily, amlodipine 10 mg daily BP Readings from Last 3 Encounters:  04/19/23 120/70  12/19/22 (!) 150/85  01/12/22 140/80  Using medication without problems or lightheadedness:  none Chest pain with exertion: none Edema:none Short of breath: none Average home BPs: Other issues:      Relevant past medical, surgical, family and social history reviewed and updated as indicated. Interim medical history since our last visit reviewed. Allergies and medications reviewed and updated. Outpatient Medications Prior to Visit  Medication Sig Dispense Refill   acyclovir (ZOVIRAX) 800 MG tablet Take 1 tablet (800 mg total) by mouth 2 (two) times daily. 10  tablet 12   amLODipine (NORVASC) 10 MG tablet Take 1 tablet (10 mg total) by mouth daily. 90 tablet 3   aspirin 81 MG tablet Take 81 mg by mouth daily.     glipiZIDE (GLUCOTROL XL) 10 MG 24 hr tablet Take 1 tablet (10 mg total) by mouth daily with breakfast. 90 tablet 3   hydrochlorothiazide (HYDRODIURIL) 25 MG tablet Take 1 tablet (25 mg total) by mouth daily. 90 tablet 3   losartan (COZAAR) 100 MG tablet Take 1 tablet (100 mg total) by mouth daily. 90 tablet 3   metFORMIN (GLUCOPHAGE-XR) 500 MG 24 hr tablet TAKE 4 TABLETS BY MOUTH ONCE DAILY WITH BREAKFAST 360 tablet 3   OZEMPIC, 2 MG/DOSE, 8 MG/3ML SOPN Inject 2 mg into the skin once a week.     rosuvastatin (CRESTOR) 40 MG tablet Take 1 tablet (40 mg total) by mouth daily. 90 tablet 3   dicyclomine (BENTYL) 20 MG tablet Take 1 tablet (20 mg total) by mouth 2 (two) times daily. 20 tablet 0   pioglitazone (ACTOS) 30 MG tablet Take 1 tablet (30 mg total) by mouth daily. 90 tablet 3   No facility-administered medications prior to visit.     Per HPI unless specifically indicated in ROS section below Review of Systems  Constitutional:  Negative for fatigue and fever.  HENT:  Negative for congestion.   Eyes:  Negative for pain.  Respiratory:  Negative for cough and shortness of breath.   Cardiovascular:  Negative for chest pain, palpitations and leg swelling.  Gastrointestinal:  Negative for abdominal pain.  Genitourinary:  Negative for dysuria and vaginal bleeding.  Musculoskeletal:  Negative for back pain.  Neurological:  Negative for syncope, light-headedness and headaches.  Psychiatric/Behavioral:  Negative for dysphoric mood.     Objective:  BP 120/70 (BP Location: Right Arm, Patient Position: Sitting, Cuff Size: Large)   Pulse (!) 107   Temp 98.2 F (36.8 C) (Temporal)   Ht 5\' 5"  (1.651 m)   Wt 249 lb 2 oz (113 kg)   LMP 02/07/2012   SpO2 98%   BMI 41.46 kg/m   Wt Readings from Last 3 Encounters:  04/19/23 249 lb 2 oz (113  kg)  12/19/22 250 lb (113.4 kg)  01/12/22 263 lb (119.3 kg)      Physical Exam Vitals and nursing note reviewed.  Constitutional:      General: She is not in acute distress.    Appearance: Normal appearance. She is well-developed. She is not ill-appearing or toxic-appearing.  HENT:     Head: Normocephalic.     Right Ear: Hearing, tympanic membrane, ear canal and external ear normal.     Left Ear: Hearing, tympanic membrane, ear canal and external ear normal.     Nose: Nose normal.  Eyes:     General: Lids are normal. Lids are everted, no foreign bodies appreciated.     Conjunctiva/sclera: Conjunctivae normal.     Pupils: Pupils are equal, round, and reactive to light.  Neck:     Thyroid: No thyroid mass or thyromegaly.     Vascular: No carotid bruit.     Trachea: Trachea normal.  Cardiovascular:     Rate and Rhythm: Normal rate and regular rhythm.     Heart sounds: Normal heart sounds, S1 normal and S2 normal. No murmur heard.    No gallop.  Pulmonary:     Effort: Pulmonary effort is normal. No respiratory distress.     Breath sounds: Normal breath sounds. No wheezing, rhonchi or rales.  Abdominal:     General: Bowel sounds are normal. There is no distension or abdominal bruit.     Palpations: Abdomen is soft. There is no fluid wave or mass.     Tenderness: There is no abdominal tenderness. There is no guarding or rebound.     Hernia: No hernia is present.  Musculoskeletal:     Cervical back: Normal range of motion and neck supple.  Lymphadenopathy:     Cervical: No cervical adenopathy.  Skin:    General: Skin is warm and dry.     Findings: No rash.  Neurological:     Mental Status: She is alert.     Cranial Nerves: No cranial nerve deficit.     Sensory: No sensory deficit.  Psychiatric:        Mood and Affect: Mood is not anxious or depressed.        Speech: Speech normal.        Behavior: Behavior normal. Behavior is cooperative.        Judgment: Judgment normal.      Diabetic foot exam: Normal inspection No skin breakdown No calluses  Normal DP pulses Normal sensation to light touch and monofilament Nails  yellow discoloration and  periungual debris.     Results for orders placed or performed in  visit on 04/19/23  HM MAMMOGRAPHY  Result Value Ref Range   HM Mammogram 0-4 Bi-Rad 0-4 Bi-Rad, Self Reported Normal  HM DIABETES FOOT EXAM  Result Value Ref Range   HM Diabetic Foot Exam done     Assessment and Plan The patient's preventative maintenance and recommended screening tests for an annual wellness exam were reviewed in full today. Brought up to date unless services declined.  Counselled on the importance of diet, exercise, and its role in overall health and mortality. The patient's FH and SH was reviewed, including their home life, tobacco status, and drug and alcohol status.   Vaccines: refuse flu, and shingrix. Pap/DVE:  partial hysterectomy Mammo:  09/02/2023, no family history of breast cancer. Bone Density: not indicated Colon:  plan to schedule Smoking Status: none ETOH/ drug use: rare/none  Hep C:  done  HIV screen:   refused  EYE patty vision 2024   Routine general medical examination at a health care facility  Diabetes mellitus with no complication [facility]) Assessment & Plan: Due for lab reevaluation.  Orders: -     Comprehensive metabolic panel -     Hemoglobin A1c -     Microalbumin / creatinine urine ratio  Hyperlipidemia associated with type 2 diabetes mellitus (HCC) Assessment & Plan: Due for lab reevaluation  Orders: -     Comprehensive metabolic panel -     Lipid panel  Screening for HIV (human immunodeficiency virus)  Vitamin D deficiency Assessment & Plan: Due for reevaluation  Orders: -     VITAMIN D 25 Hydroxy (Vit-D Deficiency, Fractures)  Hypertension associated with diabetes (HCC) Assessment & Plan: Stable, chronic.  Continue current medication.   losartan 100 mg daily, HCTZ 25  mng daily, amlodipine 10 mg daily   B12 deficiency Assessment & Plan: Dure for re-eval.  Orders: -     Vitamin B12  Hx of iron deficiency anemia Assessment & Plan: Due to fibroids , now resolved after hysterectomy   Toenail fungus  Other orders -     Terbinafine HCl; Take 1 tablet (250 mg total) by mouth daily.  Dispense: 90 tablet; Refill: 0     Return in about 3 months (around 07/20/2023) for diabetes follow up.   Kerby Nora, MD

## 2023-04-22 ENCOUNTER — Other Ambulatory Visit: Payer: Self-pay | Admitting: Family Medicine

## 2023-04-22 ENCOUNTER — Encounter: Payer: Self-pay | Admitting: Family Medicine

## 2023-04-22 MED ORDER — VITAMIN D (ERGOCALCIFEROL) 1.25 MG (50000 UNIT) PO CAPS: 50000.0000 [IU] | ORAL_CAPSULE | ORAL | 0 refills | Status: DC

## 2023-05-09 MED ORDER — VITAMIN D (ERGOCALCIFEROL) 1.25 MG (50000 UNIT) PO CAPS: 50000.0000 [IU] | ORAL_CAPSULE | ORAL | 0 refills | Status: DC

## 2023-05-09 NOTE — Addendum Note (Signed)
Addended by: Damita Lack on: 05/09/2023 10:44 AM   Modules accepted: Orders

## 2023-05-27 ENCOUNTER — Other Ambulatory Visit: Payer: Self-pay | Admitting: Family Medicine

## 2023-05-27 DIAGNOSIS — Z1212 Encounter for screening for malignant neoplasm of rectum: Secondary | ICD-10-CM

## 2023-05-27 DIAGNOSIS — Z1211 Encounter for screening for malignant neoplasm of colon: Secondary | ICD-10-CM

## 2023-06-20 ENCOUNTER — Other Ambulatory Visit: Payer: Self-pay | Admitting: Family Medicine

## 2023-06-21 MED ORDER — OZEMPIC (2 MG/DOSE) 8 MG/3ML ~~LOC~~ SOPN: 2.0000 mg | PEN_INJECTOR | SUBCUTANEOUS | 0 refills | Status: DC

## 2023-06-21 NOTE — Telephone Encounter (Signed)
Please schedule Diabetes follow up with Dr. Ermalene Searing around 07/20/23.

## 2023-06-21 NOTE — Telephone Encounter (Signed)
Spoke to pt, pt states she'd call back to schedule once she has her calendar

## 2023-06-27 ENCOUNTER — Other Ambulatory Visit: Payer: Self-pay | Admitting: Family Medicine

## 2023-06-27 NOTE — Telephone Encounter (Signed)
Patient has been scheduled

## 2023-06-27 NOTE — Telephone Encounter (Signed)
Attempted to call patient . Mailbox full.sent mychart message

## 2023-06-27 NOTE — Telephone Encounter (Signed)
Last office visit 04/19/23 for CPE.  Last refilled 05/09/2023 for #12 with no refills.  Last Vit D level 04/19/2023 which was low at 25.92 ng/mL.  Next Appt:  No future appointments.    Please schedule: Return in (around 07/20/2023) for diabetes follow up.

## 2023-07-22 ENCOUNTER — Other Ambulatory Visit: Payer: Self-pay | Admitting: Family Medicine

## 2023-07-26 ENCOUNTER — Ambulatory Visit: Payer: Medicaid Other | Admitting: Family Medicine

## 2023-07-26 VITALS — BP 122/84 | HR 102 | Temp 97.9°F | Ht 65.0 in | Wt 247.2 lb

## 2023-07-26 DIAGNOSIS — Z7984 Long term (current) use of oral hypoglycemic drugs: Secondary | ICD-10-CM | POA: Diagnosis not present

## 2023-07-26 DIAGNOSIS — E785 Hyperlipidemia, unspecified: Secondary | ICD-10-CM

## 2023-07-26 DIAGNOSIS — I152 Hypertension secondary to endocrine disorders: Secondary | ICD-10-CM

## 2023-07-26 DIAGNOSIS — E119 Type 2 diabetes mellitus without complications: Secondary | ICD-10-CM

## 2023-07-26 DIAGNOSIS — E1159 Type 2 diabetes mellitus with other circulatory complications: Secondary | ICD-10-CM | POA: Diagnosis not present

## 2023-07-26 DIAGNOSIS — E1169 Type 2 diabetes mellitus with other specified complication: Secondary | ICD-10-CM

## 2023-07-26 DIAGNOSIS — Z7985 Long-term (current) use of injectable non-insulin antidiabetic drugs: Secondary | ICD-10-CM | POA: Diagnosis not present

## 2023-07-26 LAB — POCT GLYCOSYLATED HEMOGLOBIN (HGB A1C): Hemoglobin A1C: 7.5 % — AB (ref 4.0–5.6)

## 2023-07-26 MED ORDER — DEXCOM G7 SENSOR MISC: 11 refills | Status: DC

## 2023-07-26 NOTE — Progress Notes (Signed)
Patient ID: Courtney Cox, female    DOB: 03/18/1966, 57 y.o.   MRN: [id number]  This visit was conducted in person.  BP 122/84 (BP Location: Left Arm, Patient Position: Sitting, Cuff Size: Large)   Pulse (!) 102   Temp 97.9 F (36.6 C) (Temporal)   Ht 5\' 5"  (1.651 m)   Wt 247 lb 4 oz (112.2 kg)   LMP 02/07/2012   SpO2 99%   BMI 41.14 kg/m    CC:  Chief Complaint  Patient presents with   Diabetes    Subjective:   HPI: SUMYA Cox is a 57 y.o. female presenting on 07/26/2023 for Diabetes   Temporarily saw Dr. Suzette Battiest.  Started on ozempic.  Diabetes: Improved control of A1c on the Glucotrol XL 10 mg p.o. daily, metformin 500 mg 4 tablets daily, Ozempic 2 mg once a week ( she feels full quickly)  Doing intermittent fasting. Lab Results  Component Value Date   HGBA1C 7.5 (A) 07/26/2023  Using medications without difficulties: none Hypoglycemic episodes: none Hyperglycemic episodes: none Feet problems: none Blood Sugars averaging: FBS  119, 2 hour postprandial  132 eye exam within last year: yes   Walking daily... at work    Has lost 20 lbs in last year on ozempic.  No SE to ozempic.  She has more energy since losing weight. Wt Readings from Last 3 Encounters:  07/26/23 247 lb 4 oz (112.2 kg)  04/19/23 249 lb 2 oz (113 kg)  12/19/22 250 lb (113.4 kg)    Hypertension:   losartan 100 mg daily, HCTZ 25 mng daily, amlodipine 10 mg daily BP Readings from Last 3 Encounters:  07/26/23 122/84  04/19/23 120/70  12/19/22 (!) 150/85  Using medication without problems or lightheadedness:  none Chest pain with exertion: none Edema:none Short of breath: none Average home BPs: 125/78 Other issues:  Elevated Cholesterol: At last check LDL 100 on rosuvastatin 40 mg p.o. daily Lab Results  Component Value Date   CHOL 156 04/19/2023   HDL 38.30 (L) 04/19/2023   LDLCALC 100 (H) 04/19/2023   TRIG 88.0 04/19/2023   CHOLHDL 4 04/19/2023  Using medications without  problems: Muscle aches:  Diet compliance: godd Exercise: walking Other complaints:   Relevant past medical, surgical, family and social history reviewed and updated as indicated. Interim medical history since our last visit reviewed. Allergies and medications reviewed and updated. Outpatient Medications Prior to Visit  Medication Sig Dispense Refill   acyclovir (ZOVIRAX) 800 MG tablet Take 1 tablet (800 mg total) by mouth 2 (two) times daily. 10 tablet 12   amLODipine (NORVASC) 10 MG tablet Take 1 tablet (10 mg total) by mouth daily. 90 tablet 3   aspirin 81 MG tablet Take 81 mg by mouth daily.     glipiZIDE (GLUCOTROL XL) 10 MG 24 hr tablet Take 1 tablet (10 mg total) by mouth daily with breakfast. 90 tablet 3   hydrochlorothiazide (HYDRODIURIL) 25 MG tablet Take 1 tablet (25 mg total) by mouth daily. 90 tablet 3   losartan (COZAAR) 100 MG tablet Take 1 tablet (100 mg total) by mouth daily. 90 tablet 3   metFORMIN (GLUCOPHAGE-XR) 500 MG 24 hr tablet TAKE 4 TABLETS BY MOUTH ONCE DAILY WITH BREAKFAST 360 tablet 3   OZEMPIC, 2 MG/DOSE, 8 MG/3ML SOPN INJECT 2 MG INTO THE SKIN ONCE A WEEK. 3 mL 0   rosuvastatin (CRESTOR) 40 MG tablet Take 1 tablet (40 mg total) by mouth daily. 90 tablet  3   terbinafine (LAMISIL) 250 MG tablet Take 1 tablet (250 mg total) by mouth daily. 90 tablet 0   Vitamin D, Ergocalciferol, (DRISDOL) 1.25 MG (50000 UNIT) CAPS capsule TAKE 1 CAPSULE (50,000 UNITS TOTAL) BY MOUTH EVERY 7 (SEVEN) DAYS 12 capsule 0   No facility-administered medications prior to visit.     Per HPI unless specifically indicated in ROS section below Review of Systems  Constitutional:  Negative for fatigue and fever.  HENT:  Negative for congestion.   Eyes:  Negative for pain.  Respiratory:  Negative for cough and shortness of breath.   Cardiovascular:  Negative for chest pain, palpitations and leg swelling.  Gastrointestinal:  Negative for abdominal pain.  Genitourinary:  Negative for  dysuria and vaginal bleeding.  Musculoskeletal:  Negative for back pain.  Neurological:  Negative for syncope, light-headedness and headaches.  Psychiatric/Behavioral:  Negative for dysphoric mood.     Objective:  BP 122/84 (BP Location: Left Arm, Patient Position: Sitting, Cuff Size: Large)   Pulse (!) 102   Temp 97.9 F (36.6 C) (Temporal)   Ht 5\' 5"  (1.651 m)   Wt 247 lb 4 oz (112.2 kg)   LMP 02/07/2012   SpO2 99%   BMI 41.14 kg/m   Wt Readings from Last 3 Encounters:  07/26/23 247 lb 4 oz (112.2 kg)  04/19/23 249 lb 2 oz (113 kg)  12/19/22 250 lb (113.4 kg)      Physical Exam Vitals and nursing note reviewed.  Constitutional:      General: She is not in acute distress.    Appearance: Normal appearance. She is well-developed. She is not ill-appearing or toxic-appearing.  HENT:     Head: Normocephalic.     Right Ear: Hearing, tympanic membrane, ear canal and external ear normal.     Left Ear: Hearing, tympanic membrane, ear canal and external ear normal.     Nose: Nose normal.  Eyes:     General: Lids are normal. Lids are everted, no foreign bodies appreciated.     Conjunctiva/sclera: Conjunctivae normal.     Pupils: Pupils are equal, round, and reactive to light.  Neck:     Thyroid: No thyroid mass or thyromegaly.     Vascular: No carotid bruit.     Trachea: Trachea normal.  Cardiovascular:     Rate and Rhythm: Normal rate and regular rhythm.     Heart sounds: Normal heart sounds, S1 normal and S2 normal. No murmur heard.    No gallop.  Pulmonary:     Effort: Pulmonary effort is normal. No respiratory distress.     Breath sounds: Normal breath sounds. No wheezing, rhonchi or rales.  Abdominal:     General: Bowel sounds are normal. There is no distension or abdominal bruit.     Palpations: Abdomen is soft. There is no fluid wave or mass.     Tenderness: There is no abdominal tenderness. There is no guarding or rebound.     Hernia: No hernia is present.   Musculoskeletal:     Cervical back: Normal range of motion and neck supple.  Lymphadenopathy:     Cervical: No cervical adenopathy.  Skin:    General: Skin is warm and dry.     Findings: No rash.  Neurological:     Mental Status: She is alert.     Cranial Nerves: No cranial nerve deficit.     Sensory: No sensory deficit.  Psychiatric:        Mood and  Affect: Mood is not anxious or depressed.        Speech: Speech normal.        Behavior: Behavior normal. Behavior is cooperative.        Judgment: Judgment normal.     Diabetic foot exam: Normal inspection No skin breakdown No calluses  Normal DP pulses Normal sensation to light touch and monofilament Nails  yellow discoloration and  periungual debris.     Results for orders placed or performed in visit on 07/26/23  POCT glycosylated hemoglobin (Hb A1C)  Result Value Ref Range   Hemoglobin A1C 7.5 (A) 4.0 - 5.6 %   HbA1c POC (<> result, manual entry)     HbA1c, POC (prediabetic range)     HbA1c, POC (controlled diabetic range)      Assessment and Plan The patient's preventative maintenance and recommended screening tests for an annual wellness exam were reviewed in full today. Brought up to date unless services declined.  Counselled on the importance of diet, exercise, and its role in overall health and mortality. The patient's FH and SH was reviewed, including their home life, tobacco status, and drug and alcohol status.     Diabetes mellitus with no complication (HCC) Assessment & Plan: Stable, chronic.  Continue current medication. Excellent improvement with weight loss and diabetes control but not yet at goal.  Prescribed Dexcom G7 for her to be able to continuously monitor her glucose to assist with further reduction of A1c.   Improved control of A1c on the Glucotrol XL 10 mg p.o. daily, metformin 500 mg 4 tablets daily, Ozempic 2 mg once a week  Follow-up in 3 months  Orders: -     POCT glycosylated  hemoglobin (Hb A1C)  Hypertension associated with diabetes (HCC) Assessment & Plan: Stable, chronic.  Continue current medication.   losartan 100 mg daily, HCTZ 25 mng daily, amlodipine 10 mg daily   Hyperlipidemia associated with type 2 diabetes mellitus (HCC) Assessment & Plan: Stable, chronic.  Continue current medication.  Rosuvastatin 40 mg p.o. daily   Type 2 diabetes mellitus with other circulatory complications (HCC) Assessment & Plan: Stable, chronic.  Continue current medication. Excellent improvement with weight loss and diabetes control but not yet at goal.  Prescribed Dexcom G7 for her to be able to continuously monitor her glucose to assist with further reduction of A1c.   Improved control of A1c on the Glucotrol XL 10 mg p.o. daily, metformin 500 mg 4 tablets daily, Ozempic 2 mg once a week  Follow-up in 3 months   Other orders -     Dexcom G7 Sensor; Apply sensor every 10 days  Dispense: 3 each; Refill: 11     Return in about 3 months (around 10/24/2023) for diabetes follow up with fasting labs prior.   Kerby Nora, MD

## 2023-07-26 NOTE — Assessment & Plan Note (Signed)
Stable, chronic.  Continue current medication.  Rosuvastatin 40 mg p.o. daily

## 2023-07-26 NOTE — Assessment & Plan Note (Addendum)
Stable, chronic.  Continue current medication. Excellent improvement with weight loss and diabetes control but not yet at goal.  Prescribed Dexcom G7 for her to be able to continuously monitor her glucose to assist with further reduction of A1c.   Improved control of A1c on the Glucotrol XL 10 mg p.o. daily, metformin 500 mg 4 tablets daily, Ozempic 2 mg once a week  Follow-up in 3 months

## 2023-07-26 NOTE — Assessment & Plan Note (Signed)
Stable, chronic.  Continue current medication.   losartan 100 mg daily, HCTZ 25 mng daily, amlodipine 10 mg daily

## 2023-08-05 ENCOUNTER — Telehealth: Payer: Self-pay

## 2023-08-05 NOTE — Telephone Encounter (Signed)
Pharmacy Patient Advocate Encounter   Received notification from CoverMyMeds that prior authorization for Dexcom CGM supplies is required/requested.   Insurance verification completed.   The patient is insured through [facility] MEDICAID .   No PA submitted, as pt is not insulin dependent

## 2023-08-15 ENCOUNTER — Other Ambulatory Visit: Payer: Self-pay | Admitting: Family Medicine

## 2023-08-15 MED ORDER — OZEMPIC (2 MG/DOSE) 8 MG/3ML ~~LOC~~ SOPN: 2.0000 mg | PEN_INJECTOR | SUBCUTANEOUS | 0 refills | Status: DC

## 2023-09-24 ENCOUNTER — Other Ambulatory Visit: Payer: Self-pay | Admitting: Family Medicine

## 2023-09-29 ENCOUNTER — Other Ambulatory Visit (HOSPITAL_COMMUNITY): Payer: Self-pay

## 2023-09-29 ENCOUNTER — Encounter: Payer: Self-pay | Admitting: Oncology

## 2023-09-29 ENCOUNTER — Telehealth: Payer: Self-pay

## 2023-09-29 NOTE — Telephone Encounter (Signed)
 Pharmacy Patient Advocate Encounter   Received notification from CoverMyMeds that prior authorization for Ozempic  (2 MG/DOSE) 8MG /3ML pen-injectors is required/requested.   Insurance verification completed.   The patient is insured through  [place] [employer]  .   Per test claim: PA required; PA submitted to above mentioned insurance via CoverMyMeds Key/confirmation #/EOC (Key: [id number])  Status is pending

## 2023-09-30 ENCOUNTER — Other Ambulatory Visit (HOSPITAL_COMMUNITY): Payer: Self-pay

## 2023-09-30 NOTE — Telephone Encounter (Signed)
 Pharmacy Patient Advocate Encounter  Received notification from  [place] [employer]  that Prior Authorization for Ozempic  has been APPROVED from 1.23.25 to 2.6.26. Ran test claim, Copay is $4.00. This test claim was processed through [facility]- copay amounts may vary at other pharmacies due to pharmacy/plan contracts, or as the patient moves through the different stages of their insurance plan.

## 2023-10-27 ENCOUNTER — Encounter: Payer: Self-pay | Admitting: Family Medicine

## 2023-10-27 ENCOUNTER — Ambulatory Visit: Admitting: Family Medicine

## 2023-10-27 VITALS — BP 126/82 | HR 89 | Temp 97.4°F | Ht 65.0 in | Wt 248.0 lb

## 2023-10-27 DIAGNOSIS — L989 Disorder of the skin and subcutaneous tissue, unspecified: Secondary | ICD-10-CM | POA: Insufficient documentation

## 2023-10-27 NOTE — Patient Instructions (Addendum)
 Patient-administered use of permethrin 1% cream rinse or  ?Ensure the skin is cool and dry before application to minimize percutaneous absorption ?Apply the pediculicide to all areas of suspected involvement (typically pubic and perianal areas, thighs, trunk, and axillae) ?Wash off the pediculicide after 10 minutes ?Remove nits with fingernails, a nit comb, or tweezers ?Put on clean underwear and clothing following treatment More than one application may be necessary to eradicate infestation.

## 2023-10-27 NOTE — Progress Notes (Signed)
 Patient ID: Courtney Cox, female    DOB: 1966-04-23, 58 y.o.   MRN: [id number]  This visit was conducted in person.  BP 126/82   Pulse 89   Temp (!) 97.4 F (36.3 C) (Temporal)   Ht 5\' 5"  (1.651 m)   Wt 248 lb (112.5 kg)   LMP 02/07/2012   SpO2 98%   BMI 41.27 kg/m    CC:  Chief Complaint  Patient presents with   Acute Visit    Patient noticed blood spots in underwear several days ago. Pt noticed after that she has a cut and scrape in that area. Pt googled symptoms and concerned it could be pubic lice. Has not been sexually active in over 6 months.     Subjective:   HPI: Courtney Cox is a 58 y.o. female presenting on 10/27/2023 for Acute Visit (Patient noticed blood spots in underwear several days ago. Pt noticed after that she has a cut and scrape in that area. Pt googled symptoms and concerned it could be pubic lice. Has not been sexually active in over 6 months. )  Patient noted a few days ago  blood spots on  upper part of underwear where pannus  tocuhes. Put  picture in her phone  under google lens... said it could be pubic lice.   Has started new vaginal spray.   Later she  noted scratches on pannus... no vaginal irritation, discharge or itching.  No pustules or vesicles. No new sexual partners.  No STD exposure    S/P TAH ... Remaining numbness at pannus .Marland Kitchen So no pain or other symtpoms.          Relevant past medical, surgical, family and social history reviewed and updated as indicated. Interim medical history since our last visit reviewed. Allergies and medications reviewed and updated. Outpatient Medications Prior to Visit  Medication Sig Dispense Refill   acyclovir (ZOVIRAX) 800 MG tablet Take 1 tablet (800 mg total) by mouth 2 (two) times daily. 10 tablet 12   amLODipine (NORVASC) 10 MG tablet Take 1 tablet (10 mg total) by mouth daily. 90 tablet 3   aspirin 81 MG tablet Take 81 mg by mouth daily.     glipiZIDE (GLUCOTROL XL) 10 MG 24 hr tablet  Take 1 tablet (10 mg total) by mouth daily with breakfast. 90 tablet 3   hydrochlorothiazide (HYDRODIURIL) 25 MG tablet Take 1 tablet (25 mg total) by mouth daily. 90 tablet 3   losartan (COZAAR) 100 MG tablet Take 1 tablet (100 mg total) by mouth daily. 90 tablet 3   metFORMIN (GLUCOPHAGE-XR) 500 MG 24 hr tablet TAKE 4 TABLETS BY MOUTH ONCE DAILY WITH BREAKFAST 360 tablet 3   OZEMPIC, 2 MG/DOSE, 8 MG/3ML SOPN INJECT 2 MG INTO THE SKIN ONCE A WEEK. 3 mL 0   rosuvastatin (CRESTOR) 40 MG tablet Take 1 tablet (40 mg total) by mouth daily. 90 tablet 3   terbinafine (LAMISIL) 250 MG tablet Take 1 tablet (250 mg total) by mouth daily. 90 tablet 0   Continuous Glucose Sensor (DEXCOM G7 SENSOR) MISC Apply sensor every 10 days (Patient not taking: Reported on 10/27/2023) 3 each 11   Vitamin D, Ergocalciferol, (DRISDOL) 1.25 MG (50000 UNIT) CAPS capsule TAKE 1 CAPSULE (50,000 UNITS TOTAL) BY MOUTH EVERY 7 (SEVEN) DAYS (Patient not taking: Reported on 10/27/2023) 12 capsule 0   No facility-administered medications prior to visit.     Per HPI unless specifically indicated in ROS section below  Review of Systems  Constitutional:  Negative for fatigue and fever.  HENT:  Negative for congestion.   Eyes:  Negative for pain.  Respiratory:  Negative for cough and shortness of breath.   Cardiovascular:  Negative for chest pain, palpitations and leg swelling.  Gastrointestinal:  Negative for abdominal pain.  Genitourinary:  Negative for dysuria, genital sores, hematuria, pelvic pain, urgency, vaginal bleeding, vaginal discharge and vaginal pain.  Musculoskeletal:  Negative for back pain.  Skin:  Positive for rash.  Neurological:  Negative for syncope, light-headedness and headaches.  Psychiatric/Behavioral:  Negative for dysphoric mood.    Objective:  BP 126/82   Pulse 89   Temp (!) 97.4 F (36.3 C) (Temporal)   Ht 5\' 5"  (1.651 m)   Wt 248 lb (112.5 kg)   LMP 02/07/2012   SpO2 98%   BMI 41.27 kg/m   Wt  Readings from Last 3 Encounters:  10/27/23 248 lb (112.5 kg)  07/26/23 247 lb 4 oz (112.2 kg)  04/19/23 249 lb 2 oz (113 kg)      Physical Exam Constitutional:      General: She is not in acute distress.    Appearance: Normal appearance. She is well-developed. She is not ill-appearing or toxic-appearing.  HENT:     Head: Normocephalic.     Right Ear: Hearing, tympanic membrane, ear canal and external ear normal. Tympanic membrane is not erythematous, retracted or bulging.     Left Ear: Hearing, tympanic membrane, ear canal and external ear normal. Tympanic membrane is not erythematous, retracted or bulging.     Nose: No mucosal edema or rhinorrhea.     Right Sinus: No maxillary sinus tenderness or frontal sinus tenderness.     Left Sinus: No maxillary sinus tenderness or frontal sinus tenderness.     Mouth/Throat:     Pharynx: Uvula midline.  Eyes:     General: Lids are normal. Lids are everted, no foreign bodies appreciated.     Conjunctiva/sclera: Conjunctivae normal.     Pupils: Pupils are equal, round, and reactive to light.  Neck:     Thyroid: No thyroid mass or thyromegaly.     Vascular: No carotid bruit.     Trachea: Trachea normal.  Cardiovascular:     Rate and Rhythm: Normal rate and regular rhythm.     Pulses: Normal pulses.     Heart sounds: Normal heart sounds, S1 normal and S2 normal. No murmur heard.    No friction rub. No gallop.  Pulmonary:     Effort: Pulmonary effort is normal. No tachypnea or respiratory distress.     Breath sounds: Normal breath sounds. No decreased breath sounds, wheezing, rhonchi or rales.  Abdominal:     General: Bowel sounds are normal.     Palpations: Abdomen is soft.     Tenderness: There is no abdominal tenderness.     Hernia: There is no hernia in the right inguinal area.  Genitourinary:    Exam position: Supine.     Pubic Area: No rash.      Labia:        Right: No rash, tenderness, lesion or injury.        Left: No rash,  tenderness or lesion.      Comments:  No nits or lice seen. Musculoskeletal:     Cervical back: Normal range of motion and neck supple.  Skin:    General: Skin is warm and dry.     Findings: No rash.  Comments: See photo  Neurological:     Mental Status: She is alert.  Psychiatric:        Mood and Affect: Mood is not anxious or depressed.        Speech: Speech normal.        Behavior: Behavior normal. Behavior is cooperative.        Thought Content: Thought content normal.        Judgment: Judgment normal.       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 Possible irritation from vaginal spray. No evidence of bacterial infection, not consistent with HSV ulcer. There is no evidence of any pubic lice or vaginal symptoms.  Recommended keeping area clean and dry and applying topical antibiotic ointment to prevent infection.      No follow-ups on file.   Kerby Nora, MD

## 2023-10-27 NOTE — Assessment & Plan Note (Signed)
 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 Possible irritation from vaginal spray. No evidence of bacterial infection, not consistent with HSV ulcer. There is no evidence of any pubic lice or vaginal symptoms.  Recommended keeping area clean and dry and applying topical antibiotic ointment to prevent infection.

## 2023-10-30 ENCOUNTER — Other Ambulatory Visit: Payer: Self-pay | Admitting: Family Medicine

## 2023-11-01 ENCOUNTER — Encounter: Payer: Self-pay | Admitting: Family Medicine

## 2024-04-10 ENCOUNTER — Ambulatory Visit: Admitting: Family Medicine

## 2024-05-24 ENCOUNTER — Encounter: Payer: Self-pay | Admitting: Oncology

## 2024-05-29 ENCOUNTER — Ambulatory Visit: Payer: Self-pay | Admitting: Family Medicine

## 2024-05-29 ENCOUNTER — Encounter: Payer: Self-pay | Admitting: Oncology

## 2024-05-29 VITALS — BP 130/88 | HR 91 | Temp 98.1°F | Ht 65.0 in | Wt 249.1 lb

## 2024-05-29 DIAGNOSIS — E785 Hyperlipidemia, unspecified: Secondary | ICD-10-CM | POA: Diagnosis not present

## 2024-05-29 DIAGNOSIS — E1159 Type 2 diabetes mellitus with other circulatory complications: Secondary | ICD-10-CM | POA: Diagnosis not present

## 2024-05-29 DIAGNOSIS — E119 Type 2 diabetes mellitus without complications: Secondary | ICD-10-CM

## 2024-05-29 DIAGNOSIS — E1169 Type 2 diabetes mellitus with other specified complication: Secondary | ICD-10-CM

## 2024-05-29 DIAGNOSIS — Z7984 Long term (current) use of oral hypoglycemic drugs: Secondary | ICD-10-CM

## 2024-05-29 DIAGNOSIS — I152 Hypertension secondary to endocrine disorders: Secondary | ICD-10-CM | POA: Diagnosis not present

## 2024-05-29 DIAGNOSIS — Z7985 Long-term (current) use of injectable non-insulin antidiabetic drugs: Secondary | ICD-10-CM

## 2024-05-29 LAB — POCT GLYCOSYLATED HEMOGLOBIN (HGB A1C): Hemoglobin A1C: 7.1 % — AB (ref 4.0–5.6)

## 2024-05-29 LAB — MICROALBUMIN / CREATININE URINE RATIO
Creatinine,U: 237.2 mg/dL
Microalb Creat Ratio: 5.2 mg/g (ref 0.0–30.0)
Microalb, Ur: 1.2 mg/dL (ref 0.0–1.9)

## 2024-05-29 LAB — HM DIABETES FOOT EXAM

## 2024-05-29 MED ORDER — HYDROCHLOROTHIAZIDE 25 MG PO TABS: 25.0000 mg | ORAL_TABLET | Freq: Every day | ORAL | 3 refills | Status: AC

## 2024-05-29 MED ORDER — AMLODIPINE BESYLATE 10 MG PO TABS: 10.0000 mg | ORAL_TABLET | Freq: Every day | ORAL | 3 refills | Status: AC

## 2024-05-29 MED ORDER — GLIPIZIDE ER 10 MG PO TB24: 10.0000 mg | ORAL_TABLET | Freq: Every day | ORAL | 3 refills | Status: AC

## 2024-05-29 MED ORDER — ROSUVASTATIN CALCIUM 40 MG PO TABS: 40.0000 mg | ORAL_TABLET | Freq: Every day | ORAL | 3 refills | Status: AC

## 2024-05-29 MED ORDER — LOSARTAN POTASSIUM 100 MG PO TABS: 100.0000 mg | ORAL_TABLET | Freq: Every day | ORAL | 3 refills | Status: AC

## 2024-05-29 MED ORDER — OZEMPIC (2 MG/DOSE) 8 MG/3ML ~~LOC~~ SOPN: 2.0000 mg | PEN_INJECTOR | SUBCUTANEOUS | 3 refills | Status: AC

## 2024-05-29 MED ORDER — METFORMIN HCL ER 500 MG PO TB24: ORAL_TABLET | ORAL | 3 refills | Status: AC

## 2024-05-29 NOTE — Assessment & Plan Note (Signed)
 Due for re-eval on  rosuvastatin  40 mg daily

## 2024-05-29 NOTE — Progress Notes (Signed)
 Patient ID: Courtney Cox, female    DOB: 11/11/1965, 58 y.o.   MRN: [id number]  This visit was conducted in person.  BP 130/88   Pulse 91   Temp 98.1 F (36.7 C) (Temporal)   Ht 5' 5 (1.651 m)   Wt 249 lb 2 oz (113 kg)   LMP 02/07/2012   SpO2 95%   BMI 41.46 kg/m    CC:  Chief Complaint  Patient presents with   Diabetes    Subjective:   HPI: Courtney Cox is a 58 y.o. female presenting on 05/29/2024 for Diabetes  Diabetes: Improved control of A1c on the Glucotrol  XL 10 mg p.o. daily, metformin  500 mg 4 tablets daily, Ozempic  2 mg once a week ... Had stopped for 2 months.. restarted 3 weeks ago.   Doing intermittent fasting. Lab Results  Component Value Date   HGBA1C 7.1 (A) 05/29/2024  Using medications without difficulties: none Hypoglycemic episodes: none Hyperglycemic episodes: none Feet problems: none Blood Sugars averaging: FBS  109 2 hour postprandial  182 eye exam within last year: yes Due for urine microalbumin and foot exam  Walking daily... at work    Has lost 20 lbs in total on ozempic , no recent weight loss  No SE to ozempic .  She has more energy since losing weight. Wt Readings from Last 3 Encounters:  05/29/24 249 lb 2 oz (113 kg)  10/27/23 248 lb (112.5 kg)  07/26/23 247 lb 4 oz (112.2 kg)    Hypertension:   losartan  100 mg daily, HCTZ 25 mng daily, amlodipine  10 mg daily BP Readings from Last 3 Encounters:  05/29/24 130/88  10/27/23 126/82  07/26/23 122/84  Using medication without problems or lightheadedness:  none Chest pain with exertion: none Edema:none Short of breath: none Average home BPs:  Other issues:  Elevated Cholesterol: At last check LDL 100 on rosuvastatin  40 mg p.o. daily... Overdue for repeat check Lab Results  Component Value Date   CHOL 156 04/19/2023   HDL 38.30 (L) 04/19/2023   LDLCALC 100 (H) 04/19/2023   TRIG 88.0 04/19/2023   CHOLHDL 4 04/19/2023  Using medications without problems: Muscle aches:   Diet compliance: godd Exercise: walking 2 days aweek Other complaints:   Relevant past medical, surgical, family and social history reviewed and updated as indicated. Interim medical history since our last visit reviewed. Allergies and medications reviewed and updated. Outpatient Medications Prior to Visit  Medication Sig Dispense Refill   amLODipine  (NORVASC ) 10 MG tablet Take 1 tablet (10 mg total) by mouth daily. 90 tablet 3   aspirin  81 MG tablet Take 81 mg by mouth daily.     glipiZIDE  (GLUCOTROL  XL) 10 MG 24 hr tablet Take 1 tablet (10 mg total) by mouth daily with breakfast. 90 tablet 3   hydrochlorothiazide  (HYDRODIURIL ) 25 MG tablet Take 1 tablet (25 mg total) by mouth daily. 90 tablet 3   losartan  (COZAAR ) 100 MG tablet Take 1 tablet (100 mg total) by mouth daily. 90 tablet 3   metFORMIN  (GLUCOPHAGE -XR) 500 MG 24 hr tablet TAKE 4 TABLETS BY MOUTH ONCE DAILY WITH BREAKFAST 360 tablet 3   OZEMPIC , 2 MG/DOSE, 8 MG/3ML SOPN INJECT 2 MG INTO THE SKIN ONCE A WEEK. 9 mL 3   rosuvastatin  (CRESTOR ) 40 MG tablet Take 1 tablet (40 mg total) by mouth daily. 90 tablet 3   acyclovir  (ZOVIRAX ) 800 MG tablet Take 1 tablet (800 mg total) by mouth 2 (two) times daily. 10 tablet  12   Continuous Glucose Sensor (DEXCOM G7 SENSOR) MISC Apply sensor every 10 days (Patient not taking: Reported on 10/27/2023) 3 each 11   terbinafine  (LAMISIL ) 250 MG tablet Take 1 tablet (250 mg total) by mouth daily. 90 tablet 0   Vitamin D , Ergocalciferol , (DRISDOL ) 1.25 MG (50000 UNIT) CAPS capsule TAKE 1 CAPSULE (50,000 UNITS TOTAL) BY MOUTH EVERY 7 (SEVEN) DAYS (Patient not taking: Reported on 10/27/2023) 12 capsule 0   No facility-administered medications prior to visit.     Per HPI unless specifically indicated in ROS section below Review of Systems  Constitutional:  Negative for fatigue and fever.  HENT:  Negative for congestion.   Eyes:  Negative for pain.  Respiratory:  Negative for cough and shortness of  breath.   Cardiovascular:  Negative for chest pain, palpitations and leg swelling.  Gastrointestinal:  Negative for abdominal pain.  Genitourinary:  Negative for dysuria and vaginal bleeding.  Musculoskeletal:  Negative for back pain.  Neurological:  Negative for syncope, light-headedness and headaches.  Psychiatric/Behavioral:  Negative for dysphoric mood.     Objective:  BP 130/88   Pulse 91   Temp 98.1 F (36.7 C) (Temporal)   Ht 5' 5 (1.651 m)   Wt 249 lb 2 oz (113 kg)   LMP 02/07/2012   SpO2 95%   BMI 41.46 kg/m   Wt Readings from Last 3 Encounters:  05/29/24 249 lb 2 oz (113 kg)  10/27/23 248 lb (112.5 kg)  07/26/23 247 lb 4 oz (112.2 kg)      Physical Exam Vitals and nursing note reviewed.  Constitutional:      General: She is not in acute distress.    Appearance: Normal appearance. She is well-developed. She is not ill-appearing or toxic-appearing.  HENT:     Head: Normocephalic.     Right Ear: Hearing, tympanic membrane, ear canal and external ear normal.     Left Ear: Hearing, tympanic membrane, ear canal and external ear normal.     Nose: Nose normal.  Eyes:     General: Lids are normal. Lids are everted, no foreign bodies appreciated.     Conjunctiva/sclera: Conjunctivae normal.     Pupils: Pupils are equal, round, and reactive to light.  Neck:     Thyroid: No thyroid mass or thyromegaly.     Vascular: No carotid bruit.     Trachea: Trachea normal.  Cardiovascular:     Rate and Rhythm: Normal rate and regular rhythm.     Heart sounds: Normal heart sounds, S1 normal and S2 normal. No murmur heard.    No gallop.  Pulmonary:     Effort: Pulmonary effort is normal. No respiratory distress.     Breath sounds: Normal breath sounds. No wheezing, rhonchi or rales.  Abdominal:     General: Bowel sounds are normal. There is no distension or abdominal bruit.     Palpations: Abdomen is soft. There is no fluid wave or mass.     Tenderness: There is no abdominal  tenderness. There is no guarding or rebound.     Hernia: No hernia is present.  Musculoskeletal:     Cervical back: Normal range of motion and neck supple.  Lymphadenopathy:     Cervical: No cervical adenopathy.  Skin:    General: Skin is warm and dry.     Findings: No rash.  Neurological:     Mental Status: She is alert.     Cranial Nerves: No cranial nerve deficit.  Sensory: No sensory deficit.  Psychiatric:        Mood and Affect: Mood is not anxious or depressed.        Speech: Speech normal.        Behavior: Behavior normal. Behavior is cooperative.        Judgment: Judgment normal.     Diabetic foot exam: Normal inspection No skin breakdown No calluses  Normal DP pulses Normal sensation to light touch and monofilament Nails  yellow discoloration and  periungual debris.     Results for orders placed or performed in visit on 05/29/24  HM DIABETES FOOT EXAM   Collection Time: 05/29/24 12:00 AM  Result Value Ref Range   HM Diabetic Foot Exam done   POCT glycosylated hemoglobin (Hb A1C)   Collection Time: 05/29/24 11:27 AM  Result Value Ref Range   Hemoglobin A1C 7.1 (A) 4.0 - 5.6 %   HbA1c POC (<> result, manual entry)     HbA1c, POC (prediabetic range)     HbA1c, POC (controlled diabetic range)      Assessment and Plan    Diabetes mellitus with no complication (HCC) -     POCT glycosylated hemoglobin (Hb A1C) -     Microalbumin / creatinine urine ratio  Type 2 diabetes mellitus with other circulatory complications (HCC) Assessment & Plan: Improving control, chronic.  Continue current medication. Excellent improvement with weight loss and diabetes control but not yet at goal.  Using Dexcom G7 for her to be able to continuously monitor her glucose to assist with further reduction of A1c.   Improved control of A1c on the Glucotrol  XL 10 mg p.o. daily, metformin  500 mg 4 tablets daily, Ozempic  2 mg once a week  Due for urine microalbumin and foot exam.   Encourage patient to get yearly eye exam.  Follow-up in 3 months   Hypertension associated with diabetes (HCC) Assessment & Plan: Stable, chronic.  Continue current medication.   losartan  100 mg daily, HCTZ 25 mng daily, amlodipine  10 mg daily   Hyperlipidemia associated with type 2 diabetes mellitus (HCC) Assessment & Plan: Due for re-eval on rosuvastatin  40 mg daily      Return in about 3 months (around 08/29/2024) for annual physical with fasting labs prior.   Greig Ring, MD

## 2024-05-29 NOTE — Assessment & Plan Note (Addendum)
 Improving control, chronic.  Continue current medication. Excellent improvement with weight loss and diabetes control but not yet at goal.  Using Dexcom G7 for her to be able to continuously monitor her glucose to assist with further reduction of A1c.   Improved control of A1c on the Glucotrol  XL 10 mg p.o. daily, metformin  500 mg 4 tablets daily, Ozempic  2 mg once a week  Due for urine microalbumin and foot exam.  Encourage patient to get yearly eye exam.  Follow-up in 3 months

## 2024-05-29 NOTE — Assessment & Plan Note (Signed)
Stable, chronic.  Continue current medication.   losartan 100 mg daily, HCTZ 25 mng daily, amlodipine 10 mg daily

## 2024-06-20 ENCOUNTER — Other Ambulatory Visit: Payer: Self-pay | Admitting: Medical Genetics

## 2024-06-20 DIAGNOSIS — Z006 Encounter for examination for normal comparison and control in clinical research program: Secondary | ICD-10-CM

## 2024-07-10 ENCOUNTER — Other Ambulatory Visit: Payer: Self-pay | Admitting: Family Medicine

## 2024-07-10 NOTE — Telephone Encounter (Signed)
 Please schedule CPE with fasting labs prior for Dr. Avelina around 08/29/24.

## 2024-07-11 ENCOUNTER — Telehealth: Payer: Self-pay | Admitting: Pharmacy Technician

## 2024-07-11 ENCOUNTER — Other Ambulatory Visit (HOSPITAL_COMMUNITY): Payer: Self-pay

## 2024-07-11 ENCOUNTER — Encounter: Payer: Self-pay | Admitting: Oncology

## 2024-07-11 NOTE — Telephone Encounter (Signed)
 Pharmacy Patient Advocate Encounter   Received notification from Onbase that prior authorization for Ozempic  (2 MG/DOSE) 8MG /3ML pen-injectors  is required/requested.   Insurance verification completed.   The patient is insured through [facility] MEDICAID.   Per test claim: PA required; PA submitted to above mentioned insurance via Latent Key/confirmation #/EOC [id number] Status is pending

## 2024-07-11 NOTE — Telephone Encounter (Signed)
 Pharmacy Patient Advocate Encounter  Received notification from [facility] MEDICAID that Prior Authorization for Ozempic  (2 MG/DOSE) 8MG /3ML pen-injectors  has been APPROVED from 07/11/24 to 07/11/25. Ran test claim, Copay is $4.00. This test claim was processed through [facility]- copay amounts may vary at other pharmacies due to pharmacy/plan contracts, or as the patient moves through the different stages of their insurance plan.   PA #/Case ID/Reference #: [id number]

## 2024-09-05 ENCOUNTER — Ambulatory Visit: Payer: Self-pay | Admitting: Family Medicine

## 2024-09-05 ENCOUNTER — Ambulatory Visit: Admitting: Family Medicine

## 2024-09-05 ENCOUNTER — Encounter: Payer: Self-pay | Admitting: Family Medicine

## 2024-09-05 VITALS — BP 122/82 | HR 94 | Temp 98.5°F | Ht 65.0 in | Wt 256.1 lb

## 2024-09-05 DIAGNOSIS — E1159 Type 2 diabetes mellitus with other circulatory complications: Secondary | ICD-10-CM | POA: Diagnosis not present

## 2024-09-05 DIAGNOSIS — N951 Menopausal and female climacteric states: Secondary | ICD-10-CM | POA: Diagnosis not present

## 2024-09-05 DIAGNOSIS — E1169 Type 2 diabetes mellitus with other specified complication: Secondary | ICD-10-CM

## 2024-09-05 DIAGNOSIS — F321 Major depressive disorder, single episode, moderate: Secondary | ICD-10-CM

## 2024-09-05 DIAGNOSIS — E785 Hyperlipidemia, unspecified: Secondary | ICD-10-CM

## 2024-09-05 LAB — LIPID PANEL
Cholesterol: 169 mg/dL (ref 28–200)
HDL: 42.5 mg/dL
LDL Cholesterol: 108 mg/dL — ABNORMAL HIGH (ref 10–99)
NonHDL: 126.79
Total CHOL/HDL Ratio: 4
Triglycerides: 93 mg/dL (ref 10.0–149.0)
VLDL: 18.6 mg/dL (ref 0.0–40.0)

## 2024-09-05 LAB — TSH: TSH: 2.14 u[IU]/mL (ref 0.35–5.50)

## 2024-09-05 LAB — COMPREHENSIVE METABOLIC PANEL WITH GFR
ALT: 14 U/L (ref 3–35)
AST: 14 U/L (ref 5–37)
Albumin: 4 g/dL (ref 3.5–5.2)
Alkaline Phosphatase: 59 U/L (ref 39–117)
BUN: 9 mg/dL (ref 6–23)
CO2: 29 meq/L (ref 19–32)
Calcium: 9.9 mg/dL (ref 8.4–10.5)
Chloride: 103 meq/L (ref 96–112)
Creatinine, Ser: 0.76 mg/dL (ref 0.40–1.20)
GFR: 87.41 mL/min
Glucose, Bld: 177 mg/dL — ABNORMAL HIGH (ref 70–99)
Potassium: 4.7 meq/L (ref 3.5–5.1)
Sodium: 139 meq/L (ref 135–145)
Total Bilirubin: 0.3 mg/dL (ref 0.2–1.2)
Total Protein: 7.3 g/dL (ref 6.0–8.3)

## 2024-09-05 LAB — HEMOGLOBIN A1C: Hgb A1c MFr Bld: 7.6 % — ABNORMAL HIGH (ref 4.6–6.5)

## 2024-09-05 NOTE — Progress Notes (Signed)
 "   Patient ID: Courtney Cox, female    DOB: July 09, 1966, 59 y.o.   MRN: [id number]  This visit was conducted in person.  BP 122/82   Pulse 94   Temp 98.5 F (36.9 C) (Oral)   Ht 5' 5 (1.651 m)   Wt 256 lb 2 oz (116.2 kg)   LMP 02/07/2012   SpO2 99%   BMI 42.62 kg/m    CC:  Chief Complaint  Patient presents with   Medical Management of Chronic Issues    Pt here for mental stability. Feeling emotional.Overwhelmed and sleeping a lot more often  X 3 weeks     Subjective:   HPI: Courtney Cox is a 59 y.o. female presenting on 09/05/2024 for Medical Management of Chronic Issues (Pt here for mental stability. Feeling emotional.Overwhelmed and sleeping a lot more often  X 3 weeks )   Poor control of mood in last  couple months, worse in the last 3 week.  Moody tearful.  Feeling overwhelmed.  Felling bad about herself.  Anhedonia  Sleeping more. Affecting interactions with others, her partner, work.  Decreased motivation.  No SI.  Has not been on medication to treat in past.  Feeling hot at night.  Post menopausal.. S/P hysterectomy, one ovary remaining. 2013.   Daughter breast cancer, stage 4.  She finds that she eats for comfort frequently.   She plans to reach out to friends that are counselors... but after we discussed it more she is open to a counselor at our office.        09/05/2024    8:25 AM  GAD 7 : Generalized Anxiety Score  Nervous, Anxious, on Edge 1  Control/stop worrying 1  Worry too much - different things 1  Trouble relaxing 0  Restless 0  Easily annoyed or irritable 2  Afraid - awful might happen 0  Total GAD 7 Score 5  Anxiety Difficulty Somewhat difficult        09/05/2024    8:25 AM 05/29/2024   11:19 AM 10/27/2023   10:56 AM  Depression screen PHQ 2/9  Decreased Interest 0 0 0  Down, Depressed, Hopeless 1 0 0  PHQ - 2 Score 1 0 0  Altered sleeping 1    Tired, decreased energy 1    Change in appetite 0    Feeling bad or failure  about yourself  3    Trouble concentrating 0    Moving slowly or fidgety/restless 0    Suicidal thoughts 0    PHQ-9 Score 6    Difficult doing work/chores Somewhat difficult           Relevant past medical, surgical, family and social history reviewed and updated as indicated. Interim medical history since our last visit reviewed. Allergies and medications reviewed and updated. Outpatient Medications Prior to Visit  Medication Sig Dispense Refill   amLODipine  (NORVASC ) 10 MG tablet Take 1 tablet (10 mg total) by mouth daily. 90 tablet 3   aspirin  81 MG tablet Take 81 mg by mouth daily.     glipiZIDE  (GLUCOTROL  XL) 10 MG 24 hr tablet Take 1 tablet (10 mg total) by mouth daily with breakfast. 90 tablet 3   hydrochlorothiazide  (HYDRODIURIL ) 25 MG tablet Take 1 tablet (25 mg total) by mouth daily. 90 tablet 3   losartan  (COZAAR ) 100 MG tablet Take 1 tablet (100 mg total) by mouth daily. 90 tablet 3   metFORMIN  (GLUCOPHAGE -XR) 500 MG 24 hr tablet TAKE 4  TABLETS BY MOUTH ONCE DAILY WITH BREAKFAST 360 tablet 3   OZEMPIC , 2 MG/DOSE, 8 MG/3ML SOPN Inject 2 mg into the skin once a week. 9 mL 3   rosuvastatin  (CRESTOR ) 40 MG tablet Take 1 tablet (40 mg total) by mouth daily. (Patient not taking: Reported on 09/05/2024) 90 tablet 3   No facility-administered medications prior to visit.     Per HPI unless specifically indicated in ROS section below Review of Systems Objective:  BP 122/82   Pulse 94   Temp 98.5 F (36.9 C) (Oral)   Ht 5' 5 (1.651 m)   Wt 256 lb 2 oz (116.2 kg)   LMP 02/07/2012   SpO2 99%   BMI 42.62 kg/m   Wt Readings from Last 3 Encounters:  09/05/24 256 lb 2 oz (116.2 kg)  05/29/24 249 lb 2 oz (113 kg)  10/27/23 248 lb (112.5 kg)      Physical Exam    Results for orders placed or performed in visit on 05/29/24  HM DIABETES FOOT EXAM   Collection Time: 05/29/24 12:00 AM  Result Value Ref Range   HM Diabetic Foot Exam done   POCT glycosylated hemoglobin (Hb  A1C)   Collection Time: 05/29/24 11:27 AM  Result Value Ref Range   Hemoglobin A1C 7.1 (A) 4.0 - 5.6 %   HbA1c POC (<> result, manual entry)     HbA1c, POC (prediabetic range)     HbA1c, POC (controlled diabetic range)    Microalbumin / creatinine urine ratio   Collection Time: 05/29/24 11:30 AM  Result Value Ref Range   Microalb, Ur 1.2 0.0 - 1.9 mg/dL   Creatinine,U 762.7 mg/dL   Microalb Creat Ratio 5.2 0.0 - 30.0 mg/g    Assessment and Plan  Current moderate episode of major depressive disorder without prior episode [facility]) Assessment & Plan: New, secondary to increased stress with daughter's illness, stress at work and menopausal symptoms. Discussed options for treatment. We will move forward with counseling. Discussed SSRI/SNRI initiation.  She will let me know at diabetes follow-up whether mood is improving or if she needs medication to help treat.  No red flags/no suicidal ideation.  Orders: -     TSH -     Ambulatory referral to Psychology  Type 2 diabetes mellitus with other circulatory complications (HCC) Assessment & Plan:  Due for re-eval.  Orders: -     Hemoglobin A1c -     Lipid panel -     Comprehensive metabolic panel with GFR  Hyperlipidemia associated with type 2 diabetes mellitus (HCC) Assessment & Plan: Due for re-eval   Menopausal symptoms Assessment & Plan: S/P hysterectomy with one ovary remaining.  Discussed hormone replacement but given daughter with breast cancer... she needs to find out genetic testing BRCA1 and 2 for daughter before we can proceed with risk evaluation.     Return in about 4 weeks (around 10/03/2024) for diabetes follow up  no labs prior.   Greig Ring, MD  "

## 2024-09-05 NOTE — Assessment & Plan Note (Signed)
 S/P hysterectomy with one ovary remaining.  Discussed hormone replacement but given daughter with breast cancer... she needs to find out genetic testing BRCA1 and 2 for daughter before we can proceed with risk evaluation.

## 2024-09-05 NOTE — Assessment & Plan Note (Signed)
 Due for re-eval.

## 2024-09-05 NOTE — Assessment & Plan Note (Signed)
 New, secondary to increased stress with daughter's illness, stress at work and menopausal symptoms. Discussed options for treatment. We will move forward with counseling. Discussed SSRI/SNRI initiation.  She will let me know at diabetes follow-up whether mood is improving or if she needs medication to help treat.  No red flags/no suicidal ideation.

## 2024-09-17 ENCOUNTER — Telehealth: Payer: Self-pay

## 2024-09-17 ENCOUNTER — Other Ambulatory Visit (HOSPITAL_COMMUNITY): Payer: Self-pay

## 2024-09-17 ENCOUNTER — Encounter: Payer: Self-pay | Admitting: Oncology

## 2024-09-17 NOTE — Telephone Encounter (Signed)
 Pharmacy Patient Advocate Encounter   Received notification from [facility] KEY that prior authorization for Ozempic  8 is required/requested.   Insurance verification completed.   The patient is insured through [facility] MEDICAID.   Per test claim: PA required; PA submitted to above mentioned insurance via Latent Key/confirmation #/EOC [id number] Status is pending
